# Patient Record
Sex: Male | Born: 1975 | Race: Black or African American | Hispanic: No | Marital: Single | Smoking: Former smoker
Health system: Southern US, Community
[De-identification: ages and names within clinical notes are randomized; demographics above are authoritative.]

## PROBLEM LIST (undated history)

## (undated) DIAGNOSIS — I10 Essential (primary) hypertension: Secondary | ICD-10-CM

---

## 2016-06-16 ENCOUNTER — Encounter (HOSPITAL_COMMUNITY): Payer: Self-pay | Admitting: Emergency Medicine

## 2016-06-16 ENCOUNTER — Ambulatory Visit (HOSPITAL_COMMUNITY)
Admission: EM | Admit: 2016-06-16 | Discharge: 2016-06-16 | Disposition: A | Discharge status: Home / Self Care | Payer: No Typology Code available for payment source | Attending: Family Medicine | Admitting: Family Medicine

## 2016-06-16 DIAGNOSIS — J029 Acute pharyngitis, unspecified: Secondary | ICD-10-CM

## 2016-06-16 MED ORDER — AMOXICILLIN-POT CLAVULANATE 875-125 MG PO TABS: 1.0000 | ORAL_TABLET | Freq: Two times a day (BID) | ORAL | 0 refills | Status: DC

## 2016-06-16 NOTE — Discharge Instructions (Signed)
Take all of medicine, drink lots of fluids, no more smoking, see your doctor if further problems  °

## 2016-06-16 NOTE — ED Triage Notes (Signed)
Pt complains of sore throat and body aches since Sunday.  He denies any fever at home.

## 2016-06-16 NOTE — ED Provider Notes (Signed)
[facility]    CSN: [id number] Arrival date & time: 06/16/16  1052     History   Chief Complaint Chief Complaint  Patient presents with  . Sore Throat  . Generalized Body Aches    HPI Jose Shannon is a 41 y.o. male.   The history is provided by the patient and the spouse.  Sore Throat  This is a new problem. The current episode started more than 2 days ago (h/o tonsillar abscess last yr, smoker). The problem has been gradually worsening. Pertinent negatives include no chest pain and no abdominal pain. The symptoms are aggravated by swallowing.    History reviewed. No pertinent past medical history.  There are no active problems to display for this patient.   History reviewed. No pertinent surgical history.     Home Medications    Prior to Admission medications   Medication Sig Start Date End Date Taking? Authorizing Provider  amoxicillin-clavulanate (AUGMENTIN) 875-125 MG tablet Take 1 tablet by mouth 2 (two) times daily. 06/16/16   Linna HoffJames D Lanny Lipkin, MD    Family History History reviewed. No pertinent family history.  Social History Social History  Substance Use Topics  . Smoking status: Current Some Day Smoker  . Smokeless tobacco: Never Used  . Alcohol use Yes     Comment: occasional     Allergies   Patient has no known allergies.   Review of Systems Review of Systems  Constitutional: Positive for chills and fever.  HENT: Positive for congestion and postnasal drip.   Respiratory: Negative.   Cardiovascular: Negative.  Negative for chest pain.  Gastrointestinal: Negative.  Negative for abdominal pain.     Physical Exam Triage Vital Signs ED Triage Vitals [06/16/16 1145]  Enc Vitals Group     BP 126/85     Pulse Rate 93     Resp      Temp 99.4 F (37.4 C)     Temp src      SpO2 99 %     Weight      Height      Head Circumference      Peak Flow      Pain Score 8     Pain Loc      Pain Edu?      Excl. in GC?     No data found.   Updated Vital Signs BP 126/85 (BP Location: Right Arm)   Pulse 93   Temp 99.4 F (37.4 C)   SpO2 99%   Visual Acuity Right Eye Distance:   Left Eye Distance:   Bilateral Distance:    Right Eye Near:   Left Eye Near:    Bilateral Near:     Physical Exam  Constitutional: He is oriented to person, place, and time. He appears well-developed and well-nourished. No distress.  HENT:  Right Ear: External ear normal.  Left Ear: External ear normal.  Nose: Nose normal.  Mouth/Throat: Oropharynx is clear and moist.  Neck: Normal range of motion. Neck supple.  Cardiovascular: Normal rate, regular rhythm, normal heart sounds and intact distal pulses.   Pulmonary/Chest: Effort normal and breath sounds normal.  Lymphadenopathy:    He has no cervical adenopathy.  Neurological: He is alert and oriented to person, place, and time.  Nursing note and vitals reviewed.    UC Treatments / Results  Labs (all labs ordered are listed, but only abnormal results are displayed) Labs Reviewed - No data to display  EKG  EKG Interpretation None       Radiology No results found.  Procedures Procedures (including critical care time)  Medications Ordered in UC Medications - No data to display   Initial Impression / Assessment and Plan / UC Course  I have reviewed the triage vital signs and the nursing notes.  Pertinent labs & imaging results that were available during my care of the patient were reviewed by me and considered in my medical decision making (see chart for details).       Final Clinical Impressions(s) / UC Diagnoses   Final diagnoses:  Pharyngitis, unspecified etiology    New Prescriptions Discharge Medication List as of 06/16/2016 12:12 PM    START taking these medications   Details  amoxicillin-clavulanate (AUGMENTIN) 875-125 MG tablet Take 1 tablet by mouth 2 (two) times daily., Starting Wed 06/16/2016, Print         Linna Hoff,  MD 06/18/16 [phone]

## 2016-10-22 ENCOUNTER — Encounter (HOSPITAL_COMMUNITY): Payer: Self-pay | Admitting: Emergency Medicine

## 2016-10-22 ENCOUNTER — Ambulatory Visit (HOSPITAL_COMMUNITY)
Admission: EM | Admit: 2016-10-22 | Discharge: 2016-10-22 | Disposition: A | Discharge status: Home / Self Care | Payer: No Typology Code available for payment source | Attending: Internal Medicine | Admitting: Internal Medicine

## 2016-10-22 DIAGNOSIS — M545 Low back pain, unspecified: Secondary | ICD-10-CM

## 2016-10-22 DIAGNOSIS — S39012A Strain of muscle, fascia and tendon of lower back, initial encounter: Secondary | ICD-10-CM | POA: Diagnosis not present

## 2016-10-22 DIAGNOSIS — T148XXA Other injury of unspecified body region, initial encounter: Secondary | ICD-10-CM | POA: Diagnosis not present

## 2016-10-22 MED ORDER — METHOCARBAMOL 500 MG PO TABS: 500.0000 mg | ORAL_TABLET | Freq: Two times a day (BID) | ORAL | 0 refills | Status: DC

## 2016-10-22 MED ORDER — TRAMADOL HCL 50 MG PO TABS: 50.0000 mg | ORAL_TABLET | Freq: Four times a day (QID) | ORAL | 0 refills | Status: DC | PRN

## 2016-10-22 MED ORDER — KETOROLAC TROMETHAMINE 60 MG/2ML IM SOLN
45.0000 mg | Freq: Once | INTRAMUSCULAR | Status: AC
  Administered 2016-10-22: 45 mg via INTRAMUSCULAR

## 2016-10-22 MED ORDER — NAPROXEN 375 MG PO TABS: 375.0000 mg | ORAL_TABLET | Freq: Two times a day (BID) | ORAL | 0 refills | Status: DC

## 2016-10-22 MED ORDER — ACETAMINOPHEN 325 MG PO TABS
650.0000 mg | ORAL_TABLET | Freq: Once | ORAL | Status: AC
  Administered 2016-10-22: 650 mg via ORAL

## 2016-10-22 MED ORDER — ACETAMINOPHEN 325 MG PO TABS
ORAL_TABLET | ORAL | Status: AC
  Filled 2016-10-22: qty 2

## 2016-10-22 MED ORDER — KETOROLAC TROMETHAMINE 60 MG/2ML IM SOLN
INTRAMUSCULAR | Status: AC
  Filled 2016-10-22: qty 2

## 2016-10-22 NOTE — ED Provider Notes (Signed)
CSN: [id number]     Arrival date & time 10/22/16  1512 History   First MD Initiated Contact with Patient 10/22/16 1615     Chief Complaint  Patient presents with  . Back Pain   (Consider location/radiation/quality/duration/timing/severity/associated sxs/prior Treatment) 41 year old male presents to the urgent care with pain across the low back. States he awoke 2 days ago with pain has been getting worse. 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 This is a repetitive type work which requires a lot of stress to the lower back. Denies any known trauma, single injurious event, fall or other injury. Denies focal paresthesias or weakness. Pain is worse when leaning forward, rotating the waist left and right.      History reviewed. No pertinent past medical history. History reviewed. No pertinent surgical history. No family history on file. Social History  Substance Use Topics  . Smoking status: Current Some Day Smoker  . Smokeless tobacco: Never Used  . Alcohol use Yes     Comment: weekends    Review of Systems  Constitutional: Negative.   Respiratory: Negative.   Gastrointestinal: Negative.   Genitourinary: Negative.   Musculoskeletal: Positive for back pain and myalgias. Negative for neck pain and neck stiffness.       As per HPI  Skin: Negative.   Neurological: Negative for dizziness, weakness, numbness and headaches.  All other systems reviewed and are negative.   Allergies  Patient has no known allergies.  Home Medications   Prior to Admission medications   Medication Sig Start Date End Date Taking? Authorizing Provider  methocarbamol (ROBAXIN) 500 MG tablet Take 1 tablet (500 mg total) by mouth 2 (two) times daily. For muscle relaxation. May cause drowsiness. 10/22/16   Hayden Rasmussen, NP  naproxen (NAPROSYN) 375 MG tablet Take 1 tablet (375 mg total) by mouth 2 (two) times  daily. 10/22/16   Hayden Rasmussen, NP  traMADol (ULTRAM) 50 MG tablet Take 1 tablet (50 mg total) by mouth every 6 (six) hours as needed. 10/22/16   Hayden Rasmussen, NP   Meds Ordered and Administered this Visit   Medications  ketorolac (TORADOL) injection 45 mg (45 mg Intramuscular Given 10/22/16 1637)  acetaminophen (TYLENOL) tablet 650 mg (650 mg Oral Given 10/22/16 1637)    BP (!) 145/93   Pulse 90   Temp 98.5 F (36.9 C) (Oral)   Resp 16   Ht 6\' 3"  (1.905 m)   Wt 175 lb (79.4 kg)   SpO2 99%   BMI 21.87 kg/m  No data found.   Physical Exam  Constitutional: He is oriented to person, place, and time. He appears well-developed and well-nourished. No distress.  HENT:  Head: Normocephalic and atraumatic.  Eyes: EOM are normal. Left eye exhibits no discharge.  Neck: Normal range of motion. Neck supple.  Pulmonary/Chest: Effort normal and breath sounds normal.  Musculoskeletal: He exhibits tenderness. He exhibits no edema or deformity.  Tenderness across the para lumbosacral musculature. No spinal tenderness. No swelling or discoloration. Pain is reproduced with having the patient lean forward. Having the patient extend the legs at the knee also produces pain in the back. Negative straight leg raise. No lower extremity weakness.  Neurological: He is alert and oriented to person, place, and time. No cranial nerve deficit.  Skin: Skin is warm and dry.  Psychiatric: He has a normal mood and affect.  Nursing note and  vitals reviewed.   Urgent Care Course     Procedures (including critical care time)  Labs Review Labs Reviewed - No data to display  Imaging Review No results found.   Visual Acuity Review  Right Eye Distance:   Left Eye Distance:   Bilateral Distance:    Right Eye Near:   Left Eye Near:    Bilateral Near:         MDM   1. Strain of lumbar region, initial encounter   2. Acute bilateral low back pain without sciatica   3. Muscle strain    Generally for  the first couple days applying ice helps. After a couple days of pain start applying heat. You may use a heating pad or therma care wraps/patches. These last up to 8 hours or so. Start performing light stretches of the low back muscles. No heavy lifting or bending or twisting for the next few days. Most likely the reason for your back pain is due to the type of job that you do. Meds ordered this encounter  Medications  . ketorolac (TORADOL) injection 45 mg  . acetaminophen (TYLENOL) tablet 650 mg  . naproxen (NAPROSYN) 375 MG tablet    Sig: Take 1 tablet (375 mg total) by mouth 2 (two) times daily.    Dispense:  20 tablet    Refill:  0    Order Specific Question:   Supervising Provider    Answer:   Eustace MooreMURRAY, LAURA W [[id number]]  . methocarbamol (ROBAXIN) 500 MG tablet    Sig: Take 1 tablet (500 mg total) by mouth 2 (two) times daily. For muscle relaxation. May cause drowsiness.    Dispense:  20 tablet    Refill:  0    Order Specific Question:   Supervising Provider    Answer:   Eustace MooreMURRAY, LAURA W [[id number]]  . traMADol (ULTRAM) 50 MG tablet    Sig: Take 1 tablet (50 mg total) by mouth every 6 (six) hours as needed.    Dispense:  15 tablet    Refill:  0    Order Specific Question:   Supervising Provider    Answer:   Eustace MooreMURRAY, LAURA W [[id number]]       Hayden RasmussenMabe, Jeriah Skufca, NP 10/22/16 [phone]

## 2016-10-22 NOTE — ED Triage Notes (Signed)
PT reports back pain secondary to an injury at work a few years ago. PT has had a severe flare up for last 3 days. PT reports lumbar pain, bilateral hip pain, and left leg pain. PT took a BC powder and ibuprofen this AM with no relief

## 2016-10-22 NOTE — Discharge Instructions (Signed)
Generally for the first couple days applying ice helps. After a couple days of pain start applying heat. You may use a heating pad or therma care wraps/patches. These last up to 8 hours or so. Start performing light stretches of the low back muscles. No heavy lifting or bending or twisting for the next few days. Most likely the reason for your back pain is due to the type of job that you do.

## 2016-11-16 ENCOUNTER — Ambulatory Visit (INDEPENDENT_AMBULATORY_CARE_PROVIDER_SITE_OTHER): Payer: No Typology Code available for payment source

## 2016-11-16 ENCOUNTER — Ambulatory Visit (INDEPENDENT_AMBULATORY_CARE_PROVIDER_SITE_OTHER): Payer: No Typology Code available for payment source | Admitting: Physician Assistant

## 2016-11-16 ENCOUNTER — Encounter: Payer: Self-pay | Admitting: Physician Assistant

## 2016-11-16 VITALS — BP 136/87 | HR 83 | Temp 98.3°F | Resp 17 | Ht 73.5 in | Wt 178.0 lb

## 2016-11-16 DIAGNOSIS — M545 Low back pain, unspecified: Secondary | ICD-10-CM

## 2016-11-16 DIAGNOSIS — R29898 Other symptoms and signs involving the musculoskeletal system: Secondary | ICD-10-CM

## 2016-11-16 MED ORDER — TRAMADOL HCL 50 MG PO TABS: 50.0000 mg | ORAL_TABLET | Freq: Three times a day (TID) | ORAL | 0 refills | Status: DC | PRN

## 2016-11-16 MED ORDER — PREDNISONE 20 MG PO TABS: ORAL_TABLET | ORAL | 0 refills | Status: DC

## 2016-11-16 MED ORDER — MELOXICAM 15 MG PO TABS: 15.0000 mg | ORAL_TABLET | Freq: Every day | ORAL | 0 refills | Status: DC

## 2016-11-16 NOTE — Progress Notes (Signed)
PRIMARY CARE AT [facility] [address] [place] [address] [phone]  Date:  11/16/2016   Name:  Jose Shannon [id number]   DOB:  12-29-1975   MRN:  [id number]  PCP:  Doristine Bosworth, MD    History of Present Illness:  Jose Shannon is a 41 y.o. male patient who presents to PCP with  Chief Complaint  Patient presents with  . Back Pain     Patient is here today with low back pain.  This has been for months, but over the last couple of weeks the pain has worsened.  It is at his low back and radiates across.  He has no instability.  He feels weakness down his extremities and fatigue.  Pain is worsened with bending.  He has difficulty even tying his shoes.  No incontinence.  No instability.  He has tried heat and given controlled meds, but this has not helped much.  He was to get an [place] in [place] due to his pain, however he relocated without follow up. No abnormal weight loss.  There are no active problems to display for this patient.   No past medical history on file.  No past surgical history on file.  Social History  Substance Use Topics  . Smoking status: Current Every Day Smoker    Packs/day: 3.00    Years: 0.20    Types: Cigars  . Smokeless tobacco: Never Used  . Alcohol use Yes     Comment: weekends    No family history on file.  No Known Allergies  Medication list has been reviewed and updated.  No current outpatient prescriptions on file prior to visit.   No current facility-administered medications on file prior to visit.     ROS ROS otherwise unremarkable unless listed above.  Physical Examination: BP 136/87   Pulse 83   Temp 98.3 F (36.8 C) (Oral)   Resp 17   Ht 6' 1.5" (1.867 m)   Wt 178 lb (80.7 kg)   SpO2 98%   BMI 23.17 kg/m  Ideal Body Weight: Weight in (lb) to have BMI = 25: 191.7  Physical Exam  Constitutional: He is oriented to person, place, and time. He appears well-developed and well-nourished. No distress.  HENT:   Head: Normocephalic and atraumatic.  Eyes: Pupils are equal, round, and reactive to light. Conjunctivae and EOM are normal.  Cardiovascular: Normal rate and regular rhythm.  Exam reveals no friction rub.   No murmur heard. Pulmonary/Chest: Effort normal. No respiratory distress.  Musculoskeletal:       Lumbar back: He exhibits decreased range of motion (forward flexion 15 degrees.  ), bony tenderness and pain. He exhibits no swelling and no spasm.  Neurological: He is alert and oriented to person, place, and time.  Skin: Skin is warm and dry. He is not diaphoretic.  Psychiatric: He has a normal mood and affect. His behavior is normal.   Dg Lumbar Spine Complete  Result Date: 11/16/2016 CLINICAL DATA:  Low back pain for the past 3-4 days. No report of injury. EXAM: LUMBAR SPINE - COMPLETE 4+ VIEW COMPARISON:  None in PACs FINDINGS: The lumbar vertebral bodies are preserved in height. The disc space heights are well maintained. There is no spondylolisthesis. The pedicles and transverse processes are intact. There is mild facet joint hypertrophy at L5-S1. The observed portions of the sacrum are normal. There is calcification in the wall of the abdominal aorta. IMPRESSION: There is no acute bony abnormality of  the lumbar spine. There is mild degenerative facet joint change at L5-S1. Electronically Signed   By: David  Swazil[place]Jordan M.D.   On: 11/16/2016 11:29   Dg Sacrum/coccyx  Result Date: 11/16/2016 CLINICAL DATA:  3-4 days of low back and sacral discomfort with limitation of range of motion. No known injury EXAM: SACRUM AND COCCYX - 2+ VIEW COMPARISON:  Lumbar spine series of today's date FINDINGS: The sacrum is subjectively adequately mineralized as is the coccyx. The presacral soft tissues are normal. No acute sacral fracture is observed. The SI joints are grossly normal for age. There are least 3 intact sacral struts visualized. IMPRESSION: There is no acute or significant chronic bony abnormality of  the sacrum or coccyx. Electronically Signed   By: David  [place]Jordan M.D.   On: 11/16/2016 11:28   Wt Readings from Last 3 Encounters:  11/16/16 178 lb (80.7 kg)  10/22/16 175 lb (79.4 kg)     Assessment and Plan: Jose Shannon is a 41 y.o. male who is here today for low back pain.   Given steroid to aid in reduction of inflammation.  He is in significant pain, and will likely need continued surveillance of this low back pain by ortho.  Consult appreciated at this time.  Lumbar pain - Plan: DG Sacrum/Coccyx, DG Lumbar Spine Complete, predniSONE (DELTASONE) 20 MG tablet, AMB referral to orthopedics  Weakness of both lower extremities - Plan: AMB referral to orthopedics  Trena PlattStephanie Elizbeth Posa, PA-C [facility] 7/27/20183:39 PM

## 2016-11-16 NOTE — Patient Instructions (Addendum)
IF you received an x-ray today, you will receive an invoice from [facility]. Please contact [facility] at [phone] with questions or concerns regarding your invoice.   IF you received labwork today, you will receive an invoice from [facility]. Please contact [facility] at [phone] with questions or concerns regarding your invoice.   Our billing staff will not be able to assist you with questions regarding bills from these companies.  You will be contacted with the lab results as soon as they are available. The fastest way to get your results is to activate your My Chart account. Instructions are located on the last page of this paperwork. If you have not heard from us regarding the results in 2 weeks, please contact this office.    I would like you to ice the back three times per day for 15 minutes.  I am referring you to the orthopedic.  Please await contact.  Low Back Strain Rehab Ask your health care provider which exercises are safe for you. Do exercises exactly as told by your health care provider and adjust them as directed. It is normal to feel mild stretching, pulling, tightness, or discomfort as you do these exercises, but you should stop right away if you feel sudden pain or your pain gets worse. Do not begin these exercises until told by your health care provider. Stretching and range of motion exercises These exercises warm up your muscles and joints and improve the movement and flexibility of your back. These exercises also help to relieve pain, numbness, and tingling. Exercise A: Single knee to chest  1. Lie on your back on a firm surface with both legs straight. 2. Bend one of your knees. Use your hands to move your knee up toward your chest until you feel a gentle stretch in your lower back and buttock. ? Hold your leg in this position by holding onto the front of your knee. ? Keep your other leg as straight as possible. 3. Hold for __________  seconds. 4. Slowly return to the starting position. 5. Repeat with your other leg. Repeat __________ times. Complete this exercise __________ times a day. Exercise B: Prone extension on elbows  1. Lie on your abdomen on a firm surface. 2. Prop yourself up on your elbows. 3. Use your arms to help lift your chest up until you feel a gentle stretch in your abdomen and your lower back. ? This will place some of your body weight on your elbows. If this is uncomfortable, try stacking pillows under your chest. ? Your hips should stay down, against the surface that you are lying on. Keep your hip and back muscles relaxed. 4. Hold for __________ seconds. 5. Slowly relax your upper body and return to the starting position. Repeat __________ times. Complete this exercise __________ times a day. Strengthening exercises These exercises build strength and endurance in your back. Endurance is the ability to use your muscles for a long time, even after they get tired. Exercise C: Pelvic tilt 1. Lie on your back on a firm surface. Bend your knees and keep your feet flat. 2. Tense your abdominal muscles. Tip your pelvis up toward the ceiling and flatten your lower back into the floor. ? To help with this exercise, you may place a small towel under your lower back and try to push your back into the towel. 3. Hold for __________ seconds. 4. Let your muscles relax completely before you repeat this exercise. Repeat __________ times.  Complete this exercise __________ times a day. Exercise D: Alternating arm and leg raises  1. Get on your hands and knees on a firm surface. If you are on a hard floor, you may want to use padding to cushion your knees, such as an exercise mat. 2. Line up your arms and legs. Your hands should be below your shoulders, and your knees should be below your hips. 3. Lift your left leg behind you. At the same time, raise your right arm and straighten it in front of you. ? Do not lift  your leg higher than your hip. ? Do not lift your arm higher than your shoulder. ? Keep your abdominal and back muscles tight. ? Keep your hips facing the ground. ? Do not arch your back. ? Keep your balance carefully, and do not hold your breath. 4. Hold for __________ seconds. 5. Slowly return to the starting position and repeat with your right leg and your left arm. Repeat __________ times. Complete this exercise __________times a day. Exercise J: Single leg lower with bent knees 1. Lie on your back on a firm surface. 2. Tense your abdominal muscles and lift your feet off the floor, one foot at a time, so your knees and hips are bent in an "L" shape (at about 90 degrees). ? Your knees should be over your hips and your lower legs should be parallel to the floor. 3. Keeping your abdominal muscles tense and your knee bent, slowly lower one of your legs so your toe touches the ground. 4. Lift your leg back up to return to the starting position. ? Do not hold your breath. ? Do not let your back arch. Keep your back flat against the ground. 5. Repeat with your other leg. Repeat __________ times. Complete this exercise __________ times a day. Posture and body mechanics  Body mechanics refers to the movements and positions of your body while you do your daily activities. Posture is part of body mechanics. Good posture and healthy body mechanics can help to relieve stress in your body's tissues and joints. Good posture means that your spine is in its natural S-curve position (your spine is neutral), your shoulders are pulled back slightly, and your head is not tipped forward. The following are general guidelines for applying improved posture and body mechanics to your everyday activities. Standing   When standing, keep your spine neutral and your feet about hip-width apart. Keep a slight bend in your knees. Your ears, shoulders, and hips should line up.  When you do a task in which you stand in  one place for a long time, place one foot up on a stable object that is 2-4 inches (5-10 cm) high, such as a footstool. This helps keep your spine neutral. Sitting   When sitting, keep your spine neutral and keep your feet flat on the floor. Use a footrest, if necessary, and keep your thighs parallel to the floor. Avoid rounding your shoulders, and avoid tilting your head forward.  When working at a desk or a computer, keep your desk at a height where your hands are slightly lower than your elbows. Slide your chair under your desk so you are close enough to maintain good posture.  When working at a computer, place your monitor at a height where you are looking straight ahead and you do not have to tilt your head forward or downward to look at the screen. Resting   When lying down and resting, avoid positions  that are most painful for you.  If you have pain with activities such as sitting, bending, stooping, or squatting (flexion-based activities), lie in a position in which your body does not bend very much. For example, avoid curling up on your side with your arms and knees near your chest (fetal position).  If you have pain with activities such as standing for a long time or reaching with your arms (extension-based activities), lie with your spine in a neutral position and bend your knees slightly. Try the following positions: ? Lying on your side with a pillow between your knees. ? Lying on your back with a pillow under your knees. Lifting   When lifting objects, keep your feet at least shoulder-width apart and tighten your abdominal muscles.  Bend your knees and hips and keep your spine neutral. It is important to lift using the strength of your legs, not your back. Do not lock your knees straight out.  Always ask for help to lift heavy or awkward objects. This information is not intended to replace advice given to you by your health care provider. Make sure you discuss any questions  you have with your health care provider. Document Released: 04/12/2005 Document Revised: 12/18/2015 Document Reviewed: 01/22/2015 Elsevier Interactive Patient Education  [employer].

## 2016-12-15 ENCOUNTER — Ambulatory Visit (INDEPENDENT_AMBULATORY_CARE_PROVIDER_SITE_OTHER): Payer: No Typology Code available for payment source | Admitting: Orthopaedic Surgery

## 2016-12-15 ENCOUNTER — Other Ambulatory Visit (INDEPENDENT_AMBULATORY_CARE_PROVIDER_SITE_OTHER): Payer: Self-pay

## 2016-12-15 DIAGNOSIS — M5441 Lumbago with sciatica, right side: Secondary | ICD-10-CM | POA: Diagnosis not present

## 2016-12-15 DIAGNOSIS — M4807 Spinal stenosis, lumbosacral region: Secondary | ICD-10-CM

## 2016-12-15 MED ORDER — TIZANIDINE HCL 4 MG PO TABS: 4.0000 mg | ORAL_TABLET | Freq: Three times a day (TID) | ORAL | 1 refills | Status: DC | PRN

## 2016-12-15 NOTE — Progress Notes (Signed)
Office Visit Note   Patient: Jose Shannon           Date of Birth: 08/23/1975           MRN: [id number] Visit Date: 12/15/2016              Requested by: Garnetta Buddy, PA [address] [place], [place] [address] PCP: Doristine Bosworth, MD   Assessment & Plan: Visit Diagnoses:  1. Acute right-sided low back pain with right-sided sciatica     Plan: I am absolutely concerned giving his clinical exam there is some type of impingement spine or pelvis. His radicular symptoms are quite obvious as well as pain and being a reflexive on the right side is concerning. An MRI is definitely warranted to rule out pathology in the lumbar spine that is causing his symptoms. He is tried and failed all forms conservative treatment. I will send in a muscle relaxant for him. We will see him back after the MRI is obtained. All questions were encouraged and answered.  Follow-Up Instructions: Return in about 2 weeks (around 12/29/2016).   Orders:  No orders of the defined types were placed in this encounter.  Meds ordered this encounter  Medications  . tiZANidine (ZANAFLEX) 4 MG tablet    Sig: Take 1 tablet (4 mg total) by mouth every 8 (eight) hours as needed for muscle spasms.    Dispense:  60 tablet    Refill:  1      Procedures: No procedures performed   Clinical Data: No additional findings.   Subjective: No chief complaint on file. Patient is someone him seeing for the first time as a referral from either the ER or primary care physician. 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 He is getting numbness and tingling going down his right leg sometimes just the mostly behind his knee. He's had limitations with flexion extension of his lumbar spine. He's had difficulty sleeping at night. This is worsening for him. He has problems with pain when sitting. Even bowel movements and urinating certainly call some type of  pain. He says his urine is clear though. He is already been on a steroid taper and anti-inflammatories as well as pain medication. He is a thin individual. His work and activity modification and extension exercises well. This is helped and things are worsening for him.  HPI  Review of Systems He currently denies any headache, chest pain, short of breath, fever, chills, nausea, vomiting.  Objective: Vital Signs: There were no vitals taken for this visit.  Physical Exam He is alert and oriented 3 in no acute distress but obvious discomfort. He is very slow to mobilize. Ortho Exam He is a very thin individual so examination is easier. He has limited flexion-extension of the lumbar spines pain his spine appears straight and the skin is normal appearing around the spine. He has decreased sensation L5 distribution on the right side. His reflexes are not equal with normal reflexes on the left and he is almost a reflexive on the right. He has negative clonus bilaterally. He has good strength today in his lower extremities. Putting his hips at the range of motion causes some pelvic pain but no pain in the groin on either side. Specialty Comments:  No specialty comments available.  Imaging: No results found. Plain films of lumbar spine and pelvis and sacrum show no obvious bony malalignment or  deformity. He does have a lot of air in his bowel stools suggesting pain.  PMFS History: There are no active problems to display for this patient.  No past medical history on file.  No family history on file.  No past surgical history on file. Social History   Occupational History  . Not on file.   Social History Main Topics  . Smoking status: Current Every Day Smoker    Packs/day: 3.00    Years: 0.20    Types: Cigars  . Smokeless tobacco: Never Used  . Alcohol use Yes     Comment: weekends  . Drug use: Unknown    Types: Marijuana  . Sexual activity: Not on file

## 2016-12-17 ENCOUNTER — Other Ambulatory Visit: Payer: No Typology Code available for payment source

## 2017-01-03 ENCOUNTER — Ambulatory Visit (INDEPENDENT_AMBULATORY_CARE_PROVIDER_SITE_OTHER): Payer: No Typology Code available for payment source | Admitting: Physician Assistant

## 2017-07-27 ENCOUNTER — Encounter: Payer: Self-pay | Admitting: Physician Assistant

## 2018-11-24 ENCOUNTER — Encounter (HOSPITAL_COMMUNITY): Payer: Self-pay

## 2018-11-24 ENCOUNTER — Other Ambulatory Visit: Payer: Self-pay

## 2018-11-24 ENCOUNTER — Ambulatory Visit (HOSPITAL_COMMUNITY)
Admission: EM | Admit: 2018-11-24 | Discharge: 2018-11-24 | Disposition: A | Discharge status: Other Acute Inpatient Hospital | Payer: No Typology Code available for payment source

## 2018-11-24 ENCOUNTER — Inpatient Hospital Stay (HOSPITAL_COMMUNITY)
Admission: EM | Admit: 2018-11-24 | Discharge: 2018-11-26 | DRG: 684 | Disposition: A | Discharge status: Home / Self Care | Payer: 59 | Attending: Internal Medicine | Admitting: Internal Medicine

## 2018-11-24 ENCOUNTER — Encounter (HOSPITAL_COMMUNITY): Payer: Self-pay | Admitting: Emergency Medicine

## 2018-11-24 DIAGNOSIS — R112 Nausea with vomiting, unspecified: Secondary | ICD-10-CM | POA: Diagnosis present

## 2018-11-24 DIAGNOSIS — Z79899 Other long term (current) drug therapy: Secondary | ICD-10-CM

## 2018-11-24 DIAGNOSIS — R252 Cramp and spasm: Secondary | ICD-10-CM

## 2018-11-24 DIAGNOSIS — F17290 Nicotine dependence, other tobacco product, uncomplicated: Secondary | ICD-10-CM | POA: Diagnosis present

## 2018-11-24 DIAGNOSIS — F1290 Cannabis use, unspecified, uncomplicated: Secondary | ICD-10-CM | POA: Diagnosis present

## 2018-11-24 DIAGNOSIS — E860 Dehydration: Secondary | ICD-10-CM | POA: Diagnosis present

## 2018-11-24 DIAGNOSIS — I10 Essential (primary) hypertension: Secondary | ICD-10-CM | POA: Diagnosis present

## 2018-11-24 DIAGNOSIS — N179 Acute kidney failure, unspecified: Principal | ICD-10-CM | POA: Diagnosis present

## 2018-11-24 DIAGNOSIS — D72829 Elevated white blood cell count, unspecified: Secondary | ICD-10-CM | POA: Diagnosis present

## 2018-11-24 DIAGNOSIS — Z20828 Contact with and (suspected) exposure to other viral communicable diseases: Secondary | ICD-10-CM | POA: Diagnosis present

## 2018-11-24 DIAGNOSIS — Z79891 Long term (current) use of opiate analgesic: Secondary | ICD-10-CM

## 2018-11-24 DIAGNOSIS — Z7952 Long term (current) use of systemic steroids: Secondary | ICD-10-CM

## 2018-11-24 LAB — COMPREHENSIVE METABOLIC PANEL
ALT: 23 U/L (ref 0–44)
AST: 46 U/L — ABNORMAL HIGH (ref 15–41)
Albumin: 5.7 g/dL — ABNORMAL HIGH (ref 3.5–5.0)
Alkaline Phosphatase: 102 U/L (ref 38–126)
Anion gap: 19 — ABNORMAL HIGH (ref 5–15)
BUN: 36 mg/dL — ABNORMAL HIGH (ref 6–20)
CO2: 21 mmol/L — ABNORMAL LOW (ref 22–32)
Calcium: 10.9 mg/dL — ABNORMAL HIGH (ref 8.9–10.3)
Chloride: 95 mmol/L — ABNORMAL LOW (ref 98–111)
Creatinine, Ser: 3.01 mg/dL — ABNORMAL HIGH (ref 0.61–1.24)
GFR calc Af Amer: 28 mL/min — ABNORMAL LOW (ref >60)
GFR calc non Af Amer: 24 mL/min — ABNORMAL LOW (ref >60)
Glucose, Bld: 93 mg/dL (ref 70–99)
Potassium: 4.2 mmol/L (ref 3.5–5.1)
Sodium: 135 mmol/L (ref 135–145)
Total Bilirubin: 1.7 mg/dL — ABNORMAL HIGH (ref 0.3–1.2)
Total Protein: 9.8 g/dL — ABNORMAL HIGH (ref 6.5–8.1)

## 2018-11-24 LAB — URINALYSIS, ROUTINE W REFLEX MICROSCOPIC
Bacteria, UA: NONE SEEN
Bilirubin Urine: NEGATIVE
Glucose, UA: NEGATIVE mg/dL
Ketones, ur: 5 mg/dL — AB
Nitrite: NEGATIVE
Protein, ur: 100 mg/dL — AB
Specific Gravity, Urine: 1.026 (ref 1.005–1.030)
pH: 5 (ref 5.0–8.0)

## 2018-11-24 LAB — CBC
HCT: 47.9 % (ref 39.0–52.0)
Hemoglobin: 16 g/dL (ref 13.0–17.0)
MCH: 23.4 pg — ABNORMAL LOW (ref 26.0–34.0)
MCHC: 33.4 g/dL (ref 30.0–36.0)
MCV: 70.1 fL — ABNORMAL LOW (ref 80.0–100.0)
Platelets: 283 10*3/uL (ref 150–400)
RBC: 6.83 MIL/uL — ABNORMAL HIGH (ref 4.22–5.81)
RDW: 17.7 % — ABNORMAL HIGH (ref 11.5–15.5)
WBC: 13 10*3/uL — ABNORMAL HIGH (ref 4.0–10.5)
nRBC: 0.2 % (ref 0.0–0.2)

## 2018-11-24 LAB — SARS CORONAVIRUS 2 BY RT PCR (HOSPITAL ORDER, PERFORMED IN ~~LOC~~ HOSPITAL LAB): SARS Coronavirus 2: NEGATIVE

## 2018-11-24 LAB — MAGNESIUM: Magnesium: 2.4 mg/dL (ref 1.7–2.4)

## 2018-11-24 LAB — LIPASE, BLOOD: Lipase: 25 U/L (ref 11–51)

## 2018-11-24 LAB — CK: Total CK: 1052 U/L — ABNORMAL HIGH (ref 49–397)

## 2018-11-24 MED ORDER — LACTATED RINGERS IV BOLUS
1000.0000 mL | Freq: Once | INTRAVENOUS | Status: AC
  Administered 2018-11-24: 1000 mL via INTRAVENOUS

## 2018-11-24 MED ORDER — ONDANSETRON 4 MG PO TBDP
4.0000 mg | ORAL_TABLET | Freq: Once | ORAL | Status: AC | PRN
  Administered 2018-11-24: 4 mg via ORAL
  Filled 2018-11-24: qty 1

## 2018-11-24 MED ORDER — SODIUM CHLORIDE 0.9% FLUSH: 3.0000 mL | Freq: Once | INTRAVENOUS | Status: DC

## 2018-11-24 MED ORDER — LACTATED RINGERS IV BOLUS
1000.0000 mL | Freq: Once | INTRAVENOUS | Status: AC
  Administered 2018-11-24: 22:00:00 1000 mL via INTRAVENOUS

## 2018-11-24 NOTE — H&P (Signed)
History and Physical   Jose Shannon JKK:[id number] DOB: 07-09-1975 DOA: 11/24/2018  Referring MD/NP/PA: Dr. Jola Schmidt  PCP: Forrest Moron, MD   Outpatient Specialists: None  Patient coming from: Home  Chief Complaint: Nausea vomiting and muscle cramping  HPI: Jose Shannon is a 43 y.o. male with medical history significant of degenerative disc disease otherwise no significant past medical history who has been having generalized malaise and muscle cramping as well as intractable nausea vomiting for 2 days.  Patient denied any sick contact.  He denied any fever.  Denied any diarrhea.  Patient has significant weakness.  He has tried giving Luke's and no success.  He came to the ER where he was found to be in acute kidney injury and is therefore being admitted for treatment.  Patient is still unable to keep anything down.  Initial supportive care in the ER did not resolve patient's symptoms.  Patient reported using marijuana and his last usage was 4 days ago.  Denied any other drug abuse..  ED Course: Temperature is 98.1 blood pressure 143/106 pulse 113 respirate of 18 oxygen sat 93% on room air.  White count is 13.0 sodium 135 potassium 4.2 chloride 95 CO2 of 21 BUN 36 and creatinine 3.01 calcium 10.9.Urinalysis is negative.  Urine drug screen is pending and patient otherwise stable so he will be admitted for further work-up.  Review of Systems: As per HPI otherwise 10 point review of systems negative.    History reviewed. No pertinent past medical history.  History reviewed. No pertinent surgical history.   reports that he has been smoking cigars. He has a 0.60 pack-year smoking history. He has never used smokeless tobacco. He reports current alcohol use.  Drug: Marijuana.  No Known Allergies  No family history on file.   Prior to Admission medications   Medication Sig Start Date End Date Taking? Authorizing Provider  acetaminophen (TYLENOL) 650 MG CR  tablet Take 650 mg by mouth every 8 (eight) hours as needed for pain.   Yes [provider]  meclizine (DRAMAMINE II) 25 MG tablet Take 25-50 mg by mouth 3 (three) times daily as needed for nausea.   Yes [provider]  meloxicam (MOBIC) 15 MG tablet Take 1 tablet (15 mg total) by mouth daily. Patient not taking: Reported on 11/24/2018 11/16/16   Ivar Drape D, PA  predniSONE (DELTASONE) 20 MG tablet Take 3 PO QAM x2days, 2 PO QAM x2days, 1 PO QAM x3days Patient not taking: Reported on 11/24/2018 11/16/16   Ivar Drape D, PA  tiZANidine (ZANAFLEX) 4 MG tablet Take 1 tablet (4 mg total) by mouth every 8 (eight) hours as needed for muscle spasms. Patient not taking: Reported on 11/24/2018 12/15/16   Mcarthur Rossetti, MD  traMADol (ULTRAM) 50 MG tablet Take 1-2 tablets (50-100 mg total) by mouth every 8 (eight) hours as needed. Patient not taking: Reported on 11/24/2018 11/16/16   Joretta Bachelor, [place]    Physical Exam: Vitals:   11/24/18 1941 11/24/18 2200  BP: (!) 141/111 (!) 143/106  Pulse: (!) 113 94  Resp: 18 16  Temp: 98.1 F (36.7 C)   TempSrc: Oral   SpO2: 98% 93%      Constitutional: NAD, anxious Vitals:   11/24/18 1941 11/24/18 2200  BP: (!) 141/111 (!) 143/106  Pulse: (!) 113 94  Resp: 18 16  Temp: 98.1 F (36.7 C)   TempSrc: Oral   SpO2: 98% 93%   Eyes: PERRL, lids  and conjunctivae normal ENMT: Mucous membranes are dry. Posterior pharynx clear of any exudate or lesions.Normal dentition.  Neck: normal, supple, no masses, no thyromegaly Respiratory: clear to auscultation bilaterally, no wheezing, no crackles. Normal respiratory effort. No accessory muscle use.  Cardiovascular: Regular rate and rhythm, no murmurs / rubs / gallops. No extremity edema. 2+ pedal pulses. No carotid bruits.  Abdomen: no tenderness, no masses palpated. No hepatosplenomegaly. Bowel sounds positive.  Musculoskeletal: no clubbing / cyanosis. No joint  deformity upper and lower extremities. Good ROM, no contractures. Normal muscle tone.  Skin: no rashes, lesions, ulcers. No induration Neurologic: CN 2-12 grossly intact. Sensation intact, DTR normal. Strength 5/5 in all 4.  Psychiatric: Normal judgment and insight. Alert and oriented x 3. Normal mood.     Labs on Admission: I have personally reviewed following labs and imaging studies  CBC: Recent Labs  Lab 11/24/18 2033  WBC 13.0*  HGB 16.0  HCT 47.9  MCV 70.1*  PLT 283   Basic Metabolic Panel: Recent Labs  Lab 11/24/18 2033 11/24/18 2123  NA 135  --   K 4.2  --   CL 95*  --   CO2 21*  --   GLUCOSE 93  --   BUN 36*  --   CREATININE 3.01*  --   CALCIUM 10.9*  --   MG  --  2.4   GFR: CrCl cannot be calculated (Unknown ideal weight.). Liver Function Tests: Recent Labs  Lab 11/24/18 2033  AST 46*  ALT 23  ALKPHOS 102  BILITOT 1.7*  PROT 9.8*  ALBUMIN 5.7*   Recent Labs  Lab 11/24/18 2033  LIPASE 25   No results for input(s): AMMONIA in the last 168 hours. Coagulation Profile: No results for input(s): INR, PROTIME in the last 168 hours. Cardiac Enzymes: Recent Labs  Lab 11/24/18 2123  CKTOTAL 1,052*   BNP (last 3 results) No results for input(s): PROBNP in the last 8760 hours. HbA1C: No results for input(s): HGBA1C in the last 72 hours. CBG: No results for input(s): GLUCAP in the last 168 hours. Lipid Profile: No results for input(s): CHOL, HDL, LDLCALC, TRIG, CHOLHDL, LDLDIRECT in the last 72 hours. Thyroid Function Tests: No results for input(s): TSH, T4TOTAL, FREET4, T3FREE, THYROIDAB in the last 72 hours. Anemia Panel: No results for input(s): VITAMINB12, FOLATE, FERRITIN, TIBC, IRON, RETICCTPCT in the last 72 hours. Urine analysis:    Component Value Date/Time   COLORURINE AMBER (A) 11/24/2018 2053   APPEARANCEUR CLOUDY (A) 11/24/2018 2053   LABSPEC 1.026 11/24/2018 2053   PHURINE 5.0 11/24/2018 2053   GLUCOSEU NEGATIVE 11/24/2018 2053    HGBUR SMALL (A) 11/24/2018 2053   BILIRUBINUR NEGATIVE 11/24/2018 2053   KETONESUR 5 (A) 11/24/2018 2053   PROTEINUR 100 (A) 11/24/2018 2053   NITRITE NEGATIVE 11/24/2018 2053   LEUKOCYTESUR TRACE (A) 11/24/2018 2053   Sepsis Labs: @LABRCNTIP (procalcitonin:4,lacticidven:4) )No results found for this or any previous visit (from the past 240 hour(s)).   Radiological Exams on Admission: No results found.    Assessment/Plan Principal Problem:   ARF (acute renal failure) (HCC) Active Problems:   Nausea & vomiting   Benign essential HTN     #1 acute kidney injury: More than likely prerenal.  Patient used to be on Mobic but has not been taking it.  No fever or chills.  We will admit the patient and hydrate aggressively.  Monitor renal function.  #2 nausea with vomiting: Cause is not clear.  We will check urine  drug screen.  COVID-19 is negative.  No significant abdominal pain or diarrhea.  No distention otherwise we will check CT abdomen or acute abdominal series.  Monitor and treat symptomatically.  #3 elevated blood pressure: Patient has hypertension without known diagnosis of hypertension.  Could be secondary to acute illness.  We will check blood pressure regularly and if persistently elevated will treat.  #4 leukocytosis: Most likely due to demargination.  Monitor white count.  DVT prophylaxis: Heparin Code Status: Full code Family Communication: No family at bedside Disposition Plan: Home Consults called: None Admission status: Observation  Severity of Illness: The appropriate patient status for this patient is INPATIENT. Inpatient status is judged to be reasonable and necessary in order to provide the required intensity of service to ensure the patient's safety. The patient's presenting symptoms, physical exam findings, and initial radiographic and laboratory data in the context of their chronic comorbidities is felt to place them at high risk for further clinical  deterioration. Furthermore, it is not anticipated that the patient will be medically stable for discharge from the hospital within 2 midnights of admission. The following factors support the patient status of inpatient.   " The patient's presenting symptoms include nausea vomiting. " The worrisome physical exam findings include dry mucous membranes. " The initial radiographic and laboratory data are worrisome because of creatinine of 3.01. " The chronic co-morbidities include none.   * I certify that at the point of admission it is my clinical judgment that the patient will require inpatient hospital care spanning beyond 2 midnights from the point of admission due to high intensity of service, high risk for further deterioration and high frequency of surveillance required.Lonia Blood*    Teola Felipe,LAWAL MD [facility] Pager [phone]  If 7PM-7AM, please contact night-coverage [email or web address] Password [email or web address]  11/24/2018, 10:13 PM

## 2018-11-24 NOTE — ED Provider Notes (Signed)
[facility]    CSN: [id number] Arrival date & time: 11/24/18  0865     History   Chief Complaint Chief Complaint  Patient presents with  . Generalized Body Aches  . Emesis    HPI Jose Shannon is a 43 y.o. male.   Patient presents with multiple episodes of vomiting daily and severe muscle cramping for 2 days.  He denies fever, chills, diarrhea, or other symptoms.  He states he has been unable to keep down food or liquids since his vomiting started.       The history is provided by the patient.    History reviewed. No pertinent past medical history.  There are no active problems to display for this patient.   History reviewed. No pertinent surgical history.     Home Medications    Prior to Admission medications   Medication Sig Start Date End Date Taking? Authorizing Provider  meloxicam (MOBIC) 15 MG tablet Take 1 tablet (15 mg total) by mouth daily. 11/16/16   Ivar Drape D, PA  predniSONE (DELTASONE) 20 MG tablet Take 3 PO QAM x2days, 2 PO QAM x2days, 1 PO QAM x3days Patient not taking: Reported on 11/24/2018 11/16/16   Ivar Drape D, PA  tiZANidine (ZANAFLEX) 4 MG tablet Take 1 tablet (4 mg total) by mouth every 8 (eight) hours as needed for muscle spasms. 12/15/16   Mcarthur Rossetti, MD  traMADol (ULTRAM) 50 MG tablet Take 1-2 tablets (50-100 mg total) by mouth every 8 (eight) hours as needed. 11/16/16   Joretta Bachelor, PA    Family History History reviewed. No pertinent family history.  Social History Social History   Tobacco Use  . Smoking status: Current Every Day Smoker    Packs/day: 3.00    Years: 0.20    Pack years: 0.60    Types: Cigars  . Smokeless tobacco: Never Used  Substance Use Topics  . Alcohol use: Yes    Comment: weekends  . Drug use: Not on file     Allergies   Patient has no known allergies.   Review of Systems Review of Systems  Constitutional: Negative for chills and fever.   HENT: Negative for ear pain and sore throat.   Eyes: Negative for pain and visual disturbance.  Respiratory: Negative for cough and shortness of breath.   Cardiovascular: Negative for chest pain and palpitations.  Gastrointestinal: Positive for vomiting. Negative for abdominal pain and diarrhea.  Genitourinary: Negative for dysuria and hematuria.  Musculoskeletal: Positive for myalgias. Negative for arthralgias and back pain.  Skin: Negative for color change and rash.  Neurological: Negative for seizures and syncope.  All other systems reviewed and are negative.    Physical Exam Triage Vital Signs ED Triage Vitals [11/24/18 1844]  Enc Vitals Group     BP (!) 139/92     Pulse Rate (!) 110     Resp 18     Temp 97.6 F (36.4 C)     Temp Source Temporal     SpO2 99 %     Weight      Height      Head Circumference      Peak Flow      Pain Score 6     Pain Loc      Pain Edu?      Excl. in [place]?    No data found.  Updated Vital Signs BP (!) 139/92 (BP Location: Left Arm)   Pulse (!) 110  Temp 97.6 F (36.4 C) (Temporal)   Resp 18   SpO2 99%   Visual Acuity Right Eye Distance:   Left Eye Distance:   Bilateral Distance:    Right Eye Near:   Left Eye Near:    Bilateral Near:     Physical Exam Vitals signs and nursing note reviewed.  Constitutional:      Appearance: He is well-developed. He is ill-appearing.  HENT:     Head: Normocephalic and atraumatic.  Eyes:     Conjunctiva/sclera: Conjunctivae normal.  Neck:     Musculoskeletal: Neck supple.  Cardiovascular:     Rate and Rhythm: Normal rate and regular rhythm.     Heart sounds: No murmur.  Pulmonary:     Effort: Pulmonary effort is normal. No respiratory distress.     Breath sounds: Normal breath sounds.  Abdominal:     Palpations: Abdomen is soft.     Tenderness: There is no abdominal tenderness. There is no guarding or rebound.  Musculoskeletal:     Comments: Muscle cramping visible in forearms and  abdomen.    Skin:    General: Skin is warm and dry.     Findings: No rash.  Neurological:     Mental Status: He is alert.      UC Treatments / Results  Labs (all labs ordered are listed, but only abnormal results are displayed) Labs Reviewed - No data to display  EKG   Radiology No results found.  Procedures Procedures (including critical care time)  Medications Ordered in UC Medications - No data to display  Initial Impression / Assessment and Plan / UC Course  I have reviewed the triage vital signs and the nursing notes.  Pertinent labs & imaging results that were available during my care of the patient were reviewed by me and considered in my medical decision making (see chart for details).   Vomiting, severe muscle cramping.  Patient is tachycardic and in acute pain due to his muscle cramping.  He states he is unable to keep down foods or liquids.  Sending patient to the emergency department for additional evaluation.     Final Clinical Impressions(s) / UC Diagnoses   Final diagnoses:  Nausea and vomiting, intractability of vomiting not specified, unspecified vomiting type  Muscle cramping     Discharge Instructions     Go to the emergency department for your severe muscle cramping and multiple episodes of vomiting today.        ED Prescriptions    None     Controlled Substance Prescriptions [place] Controlled Substance Registry consulted? Not Applicable   Mickie Bailate, Brieanna Nau H, NP 11/24/18 Ernestina [id number]

## 2018-11-24 NOTE — ED Triage Notes (Signed)
Pt sent here from UC for further evaluation of emeis and full body cramps for the past 2 days, denies any blood in urine. 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

## 2018-11-24 NOTE — ED Triage Notes (Signed)
Pt sts vomiting and body aches x 3 days; pt sts feels like generalized muscle cramping; denies diarrhea

## 2018-11-24 NOTE — ED Provider Notes (Addendum)
[facility] EMERGENCY [facility]MENT Provider Note   CSN: [id number] Arrival date & time: 11/24/18  1929    History   Chief Complaint Chief Complaint  Patient presents with  . Emesis  . Generalized Body Aches    HPI Langston Gaetana MichaelisDarnell Anschutz is a 43 y.o. male.     HPI Patient presents from urgent care clinic for evaluation of nonbloody nonbilious emesis and cramps for the last 2 days.  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  Endorses drinking multiple bottles of water and Gatorade daily.  Yesterday had around 10 episodes of emesis and additional 8 episodes today.  No diarrhea.  Has only urinated twice today.  Also endorses chronic shortness of breath that has been worse over the last few weeks.  Attributes this to heavy smoking.  Is down to a little more than a pack a day.  No cough.   History reviewed. No pertinent past medical history.  Patient Active Problem List   Diagnosis Date Noted  . ARF (acute renal failure) (HCC) 11/24/2018  . Nausea & vomiting 11/24/2018  . Benign essential HTN 11/24/2018  . Leukocytosis 11/24/2018    History reviewed. No pertinent surgical history.      Home Medications    Prior to Admission medications   Medication Sig Start Date End Date Taking? Authorizing Provider  acetaminophen (TYLENOL) 650 MG CR tablet Take 650 mg by mouth every 8 (eight) hours as needed for pain.   Yes [provider]  meclizine (DRAMAMINE II) 25 MG tablet Take 25-50 mg by mouth 3 (three) times daily as needed for nausea.   Yes [provider]  meloxicam (MOBIC) 15 MG tablet Take 1 tablet (15 mg total) by mouth daily. Patient not taking: Reported on 11/24/2018 11/16/16   Trena PlattEnglish, Stephanie D, PA  predniSONE (DELTASONE) 20 MG tablet Take 3 PO QAM x2days, 2 PO QAM x2days, 1 PO QAM x3days Patient not taking: Reported on 11/24/2018 11/16/16   Trena PlattEnglish,  Stephanie D, PA  tiZANidine (ZANAFLEX) 4 MG tablet Take 1 tablet (4 mg total) by mouth every 8 (eight) hours as needed for muscle spasms. Patient not taking: Reported on 11/24/2018 12/15/16   Kathryne HitchBlackman, Christopher Y, MD  traMADol (ULTRAM) 50 MG tablet Take 1-2 tablets (50-100 mg total) by mouth every 8 (eight) hours as needed. Patient not taking: Reported on 11/24/2018 11/16/16   Garnetta BuddyEnglish, Stephanie D, PA    Family History No family history on file.  Social History Social History   Tobacco Use  . Smoking status: Current Every Day Smoker    Packs/day: 3.00    Years: 0.20    Pack years: 0.60    Types: Cigars  . Smokeless tobacco: Never Used  Substance Use Topics  . Alcohol use: Yes    Comment: weekends  . Drug use: Not on file     Allergies   Patient has no known allergies.   Review of Systems Review of Systems  Constitutional: Positive for chills. Negative for fever.  HENT: Negative for ear pain and sore throat.   Eyes: Negative for pain and visual disturbance.  Respiratory: Positive for shortness of breath. Negative for cough.   Cardiovascular: Negative for chest pain and palpitations.  Gastrointestinal: Negative for abdominal pain and vomiting.  Genitourinary: Positive for decreased urine volume. Negative for dysuria and hematuria.  Musculoskeletal: Negative for arthralgias and back pain.  Persistent cramps  Skin: Negative for color change and rash.  Neurological: Negative for seizures and syncope.  All other systems reviewed and are negative.    Physical Exam Updated Vital Signs BP (!) 143/106 (BP Location: Left Arm)   Pulse 94   Temp 98.1 F (36.7 C) (Oral)   Resp 16   SpO2 93%   Physical Exam Vitals signs and nursing note reviewed.  Constitutional:      General: He is not in acute distress.    Appearance: He is well-developed. He is ill-appearing. He is not toxic-appearing.  HENT:     Head: Normocephalic and atraumatic.  Eyes:      Conjunctiva/sclera: Conjunctivae normal.  Neck:     Musculoskeletal: Neck supple.  Cardiovascular:     Rate and Rhythm: Regular rhythm. Tachycardia present.     Heart sounds: No murmur. Gallop present.   Pulmonary:     Effort: Pulmonary effort is normal. No respiratory distress.     Breath sounds: Normal breath sounds.  Abdominal:     Palpations: Abdomen is soft.     Tenderness: There is no abdominal tenderness.  Musculoskeletal:     Right lower leg: No edema.     Left lower leg: No edema.  Skin:    General: Skin is warm and dry.  Neurological:     Mental Status: He is alert and oriented to person, place, and time.  Psychiatric:        Mood and Affect: Mood normal.      ED Treatments / Results  Labs (all labs ordered are listed, but only abnormal results are displayed) Labs Reviewed  COMPREHENSIVE METABOLIC PANEL - Abnormal; Notable for the following components:      Result Value   Chloride 95 (*)    CO2 21 (*)    BUN 36 (*)    Creatinine, Ser 3.01 (*)    Calcium 10.9 (*)    Total Protein 9.8 (*)    Albumin 5.7 (*)    AST 46 (*)    Total Bilirubin 1.7 (*)    GFR calc non Af Amer 24 (*)    GFR calc Af Amer 28 (*)    Anion gap 19 (*)    All other components within normal limits  CBC - Abnormal; Notable for the following components:   WBC 13.0 (*)    RBC 6.83 (*)    MCV 70.1 (*)    MCH 23.4 (*)    RDW 17.7 (*)    All other components within normal limits  URINALYSIS, ROUTINE W REFLEX MICROSCOPIC - Abnormal; Notable for the following components:   Color, Urine AMBER (*)    APPearance CLOUDY (*)    Hgb urine dipstick SMALL (*)    Ketones, ur 5 (*)    Protein, ur 100 (*)    Leukocytes,Ua TRACE (*)    All other components within normal limits  CK - Abnormal; Notable for the following components:   Total CK 1,052 (*)    All other components within normal limits  SARS CORONAVIRUS 2 (HOSPITAL ORDER, [place] [facility])  LIPASE, BLOOD  MAGNESIUM   RAPID URINE DRUG SCREEN, HOSP PERFORMED    EKG None  Radiology No results found.  Procedures Procedures (including critical care time)  Medications Ordered in ED Medications  sodium chloride flush (NS) 0.9 % injection 3 mL (3 mLs Intravenous Not Given 11/24/18 2037)  ondansetron (ZOFRAN-ODT) disintegrating tablet 4 mg (4 mg Oral Given 11/24/18  2010)  lactated ringers bolus 1,000 mL (1,000 mLs Intravenous New Bag/Given 11/24/18 2156)  lactated ringers bolus 1,000 mL (1,000 mLs Intravenous New Bag/Given 11/24/18 2207)     Initial Impression / Assessment and Plan / ED Course  I have reviewed the triage vital signs and the nursing notes.  Pertinent labs & imaging results that were available during my care of the patient were reviewed by me and considered in my medical decision making (see chart for details).        Mr. Shon Houghruesdale is a 43 year old male with no chronic medical problems.  I reviewed his medical history and records.  He does not take any regular medications.  His chief complaint today is nausea, vomiting, and muscle cramps.  Ultimately found to have AKI with creatinine over 3.  BUN: Creatinine ratio around 10-1, making prerenal less likely but I suspect the etiology was dehydration with his new job.  May have been a component of heat exhaustion given the emesis as well.  Mild hypochloremia at 95 and mildly decreased bicarb at 21 with anion gap of 19.  Normal sodium.  No significant anemia.  Mild leukocytosis at 13.  Normal magnesium.  His UA was notable for amber color with small hemoglobin and protein proteinuria.  CK elevated but not to rhabdomyolysis levels.  We do not have records of previous creatinine, given that he has never had kidney problems before and is otherwise healthy I think this is acute kidney injury and with ongoing nausea and emesis, he meets criteria for inpatient treatment and continued trending until improves enough for discharge.  I initiated treatment  with 2L crystalloid for rehydration.  Reviewed with hospitalist service who is in agreement.  Patient was subsequently admitted.  This patient was seen in conjunction with Dr. Patria Maneampos.  Final Clinical Impressions(s) / ED Diagnoses   Final diagnoses:  Dehydration  AKI (acute kidney injury) [facility](HCC)    ED Discharge Orders    None         Jaclynn MajorStarnes, Tya Haughey, MD 11/24/18 2308    Azalia Bilisampos, Kevin, MD 11/25/18 2241

## 2018-11-24 NOTE — ED Notes (Signed)
Pts bladder scan showed 0 mL of urine in the bladder

## 2018-11-24 NOTE — ED Notes (Signed)
ED TO INPATIENT HANDOFF REPORT  ED Nurse Name and Phone #: [phone]  S Name/Age/Gender Jose Shannon 43 y.o. male Room/Bed: 041C/041C  Code Status   Code Status: Not on file  Home/SNF/Other Home Patient oriented to: self, place, time and situation Is this baseline? Yes   Triage Complete: Triage complete  Chief Complaint Muscle Cramps/Vomiting  Triage Note Pt sent here from UC for further evaluation of emeis and full body cramps for the past 2 days, denies any blood in urine. States he just started a new job yesterday working with [employer] and has had these symptoms ever since.    Allergies No Known Allergies  Level of Care/Admitting Diagnosis ED Disposition    ED Disposition Condition Comment   Admit  [facility] [[id number]]  Level of Care: Telemetry Medical [104]  Covid Evaluation: Asymptomatic Screening Protocol (No Symptoms)  Diagnosis: ARF (acute renal failure) (HCC) [130865][[id number]]  Admitting Physician: Rometta EmeryGARBA, MOHAMMAD L [[id number]]  Attending Physician: Rometta EmeryGARBA, MOHAMMAD L [[id number]]  Estimated length of stay: past midnight tomorrow  Certification:: I certify this patient will need inpatient services for at least 2 midnights  PT Class (Do Not Modify): Inpatient [101]  PT Acc Code (Do Not Modify): Private [1]       B Medical/Surgery History History reviewed. No pertinent past medical history. History reviewed. No pertinent surgical history.   A IV Location/Drains/Wounds Patient Lines/Drains/Airways Status   Active Line/Drains/Airways    Name:   Placement date:   Placement time:   Site:   Days:   Peripheral IV 11/24/18 Right Hand   11/24/18    2146    Hand   less than 1          Intake/Output Last 24 hours No intake or output data in the 24 hours ending 11/24/18 2252  Labs/Imaging Results for orders placed or performed during the hospital encounter of 11/24/18 (from the past 48 hour(s))  Lipase, blood     Status:  None   Collection Time: 11/24/18  8:33 PM  Result Value Ref Range   Lipase 25 11 - 51 U/L    Comment: Performed at [facility], [address] [place], [place] [address]  Comprehensive metabolic panel     Status: Abnormal   Collection Time: 11/24/18  8:33 PM  Result Value Ref Range   Sodium 135 135 - 145 mmol/L   Potassium 4.2 3.5 - 5.1 mmol/L   Chloride 95 (L) 98 - 111 mmol/L   CO2 21 (L) 22 - 32 mmol/L   Glucose, Bld 93 70 - 99 mg/dL   BUN 36 (H) 6 - 20 mg/dL   Creatinine, Ser 6.293.01 (H) 0.61 - 1.24 mg/dL   Calcium 52.810.9 (H) 8.9 - 10.3 mg/dL   Total Protein 9.8 (H) 6.5 - 8.1 g/dL   Albumin 5.7 (H) 3.5 - 5.0 g/dL   AST 46 (H) 15 - 41 U/L   ALT 23 0 - 44 U/L   Alkaline Phosphatase 102 38 - 126 U/L   Total Bilirubin 1.7 (H) 0.3 - 1.2 mg/dL   GFR calc non Af Amer 24 (L) >60 mL/min   GFR calc Af Amer 28 (L) >60 mL/min   Anion gap 19 (H) 5 - 15    Comment: Performed at [facility], [address] [place], [place] [address]  CBC     Status: Abnormal   Collection Time: 11/24/18  8:33 PM  Result Value Ref Range  WBC 13.0 (H) 4.0 - 10.5 K/uL   RBC 6.83 (H) 4.22 - 5.81 MIL/uL   Hemoglobin 16.0 13.0 - 17.0 g/dL   HCT 96.047.9 45.439.0 - 09.852.0 %   MCV 70.1 (L) 80.0 - 100.0 fL   MCH 23.4 (L) 26.0 - 34.0 pg   MCHC 33.4 30.0 - 36.0 g/dL   RDW 11.917.7 (H) 14.711.5 - 82.915.5 %   Platelets 283 150 - 400 K/uL   nRBC 0.2 0.0 - 0.2 %    Comment: Performed at [facility], [address] [place], [place] [address]  Urinalysis, Routine w reflex microscopic     Status: Abnormal   Collection Time: 11/24/18  8:53 PM  Result Value Ref Range   Color, Urine AMBER (A) YELLOW    Comment: BIOCHEMICALS MAY BE AFFECTED BY COLOR   APPearance CLOUDY (A) CLEAR   Specific Gravity, Urine 1.026 1.005 - 1.030   pH 5.0 5.0 - 8.0   Glucose, UA NEGATIVE NEGATIVE mg/dL   Hgb urine dipstick SMALL (A) NEGATIVE   Bilirubin Urine NEGATIVE NEGATIVE   Ketones, ur 5 (A) NEGATIVE mg/dL   Protein, ur 086100 (A)  NEGATIVE mg/dL   Nitrite NEGATIVE NEGATIVE   Leukocytes,Ua TRACE (A) NEGATIVE   RBC / HPF 0-5 0 - 5 RBC/hpf   WBC, UA 6-10 0 - 5 WBC/hpf   Bacteria, UA NONE SEEN NONE SEEN   Squamous Epithelial / LPF 0-5 0 - 5   Mucus PRESENT    Hyaline Casts, UA PRESENT     Comment: Performed at [facility], [address] [place], [place] [address]  Magnesium     Status: None   Collection Time: 11/24/18  9:23 PM  Result Value Ref Range   Magnesium 2.4 1.7 - 2.4 mg/dL    Comment: Performed at [facility], [address] [place], [place] [address]  CK     Status: Abnormal   Collection Time: 11/24/18  9:23 PM  Result Value Ref Range   Total CK 1,052 (H) 49 - 397 U/L    Comment: Performed at [facility], [address] [place], [place] [address]   No results found.  Pending Labs Unresulted Labs (From admission, onward)    Start     Ordered   11/24/18 2247  Urine rapid drug screen (hosp performed)  ONCE - STAT,   STAT     11/24/18 2246   11/24/18 2149  SARS Coronavirus 2 [facility](Hospital order, Performed in [facility] lab)  Once,   STAT     11/24/18 2149   Signed and Held  HIV antibody (Routine Testing)  Once,   R     Signed and Held   Signed and Held  CBC  (heparin)  Once,   R    Comments: Baseline for heparin therapy IF NOT ALREADY DRAWN.  Notify MD if PLT < 100 K.    Signed and Held   Signed and Held  Creatinine, serum  (heparin)  Once,   R    Comments: Baseline for heparin therapy IF NOT ALREADY DRAWN.    Signed and Held   Signed and Held  Comprehensive metabolic panel  Tomorrow morning,   R     Signed and Held   Signed and Held  CBC  Tomorrow morning,   R     Signed and Held          Vitals/Pain Today's Vitals   11/24/18 1941 11/24/18  2200  BP: (!) 141/111 (!) 143/106  Pulse: (!) 113 94  Resp: 18 16  Temp: 98.1 F (36.7 C)   TempSrc: Oral   SpO2: 98% 93%    Isolation Precautions No active isolations  Medications Medications  sodium  chloride flush (NS) 0.9 % injection 3 mL (3 mLs Intravenous Not Given 11/24/18 2037)  ondansetron (ZOFRAN-ODT) disintegrating tablet 4 mg (4 mg Oral Given 11/24/18 2010)  lactated ringers bolus 1,000 mL (1,000 mLs Intravenous New Bag/Given 11/24/18 2156)  lactated ringers bolus 1,000 mL (1,000 mLs Intravenous New Bag/Given 11/24/18 2207)    Mobility walks Low fall risk   Focused Assessments     R Recommendations: See Admitting Provider Note  Report given to:   Additional Notes:

## 2018-11-24 NOTE — Discharge Instructions (Addendum)
Go to the emergency department for your severe muscle cramping and multiple episodes of vomiting today.

## 2018-11-25 DIAGNOSIS — I10 Essential (primary) hypertension: Secondary | ICD-10-CM | POA: Diagnosis not present

## 2018-11-25 DIAGNOSIS — R112 Nausea with vomiting, unspecified: Secondary | ICD-10-CM | POA: Diagnosis not present

## 2018-11-25 DIAGNOSIS — D72829 Elevated white blood cell count, unspecified: Secondary | ICD-10-CM | POA: Diagnosis not present

## 2018-11-25 DIAGNOSIS — N179 Acute kidney failure, unspecified: Secondary | ICD-10-CM | POA: Diagnosis not present

## 2018-11-25 LAB — COMPREHENSIVE METABOLIC PANEL
ALT: 19 U/L (ref 0–44)
AST: 40 U/L (ref 15–41)
Albumin: 4.3 g/dL (ref 3.5–5.0)
Alkaline Phosphatase: 75 U/L (ref 38–126)
Anion gap: 11 (ref 5–15)
BUN: 32 mg/dL — ABNORMAL HIGH (ref 6–20)
CO2: 24 mmol/L (ref 22–32)
Calcium: 9.7 mg/dL (ref 8.9–10.3)
Chloride: 100 mmol/L (ref 98–111)
Creatinine, Ser: 1.93 mg/dL — ABNORMAL HIGH (ref 0.61–1.24)
GFR calc Af Amer: 48 mL/min — ABNORMAL LOW (ref >60)
GFR calc non Af Amer: 42 mL/min — ABNORMAL LOW (ref >60)
Glucose, Bld: 111 mg/dL — ABNORMAL HIGH (ref 70–99)
Potassium: 3.8 mmol/L (ref 3.5–5.1)
Sodium: 135 mmol/L (ref 135–145)
Total Bilirubin: 1.2 mg/dL (ref 0.3–1.2)
Total Protein: 7.6 g/dL (ref 6.5–8.1)

## 2018-11-25 LAB — CBC
HCT: 40.8 % (ref 39.0–52.0)
Hemoglobin: 13.7 g/dL (ref 13.0–17.0)
MCH: 23.4 pg — ABNORMAL LOW (ref 26.0–34.0)
MCHC: 33.6 g/dL (ref 30.0–36.0)
MCV: 69.7 fL — ABNORMAL LOW (ref 80.0–100.0)
Platelets: 212 10*3/uL (ref 150–400)
RBC: 5.85 MIL/uL — ABNORMAL HIGH (ref 4.22–5.81)
RDW: 15.4 % (ref 11.5–15.5)
WBC: 9.7 10*3/uL (ref 4.0–10.5)
nRBC: 0.2 % (ref 0.0–0.2)

## 2018-11-25 LAB — RAPID URINE DRUG SCREEN, HOSP PERFORMED
Amphetamines: NOT DETECTED
Barbiturates: NOT DETECTED
Benzodiazepines: NOT DETECTED
Cocaine: NOT DETECTED
Opiates: NOT DETECTED
Tetrahydrocannabinol: POSITIVE — AB

## 2018-11-25 LAB — HIV ANTIBODY (ROUTINE TESTING W REFLEX): HIV Screen 4th Generation wRfx: NONREACTIVE

## 2018-11-25 MED ORDER — ONDANSETRON HCL 4 MG/2ML IJ SOLN: 4.0000 mg | Freq: Four times a day (QID) | INTRAMUSCULAR | Status: DC | PRN

## 2018-11-25 MED ORDER — DEXTROSE-NACL 5-0.9 % IV SOLN
INTRAVENOUS | Status: DC
  Administered 2018-11-25 – 2018-11-26 (×4): via INTRAVENOUS

## 2018-11-25 MED ORDER — HYDRALAZINE HCL 20 MG/ML IJ SOLN: 5.0000 mg | INTRAMUSCULAR | Status: DC | PRN

## 2018-11-25 MED ORDER — ACETAMINOPHEN 325 MG PO TABS
650.0000 mg | ORAL_TABLET | Freq: Four times a day (QID) | ORAL | Status: DC | PRN
  Administered 2018-11-25 (×2): 650 mg via ORAL
  Filled 2018-11-25 (×2): qty 2

## 2018-11-25 MED ORDER — ONDANSETRON HCL 4 MG PO TABS: 4.0000 mg | ORAL_TABLET | Freq: Four times a day (QID) | ORAL | Status: DC | PRN

## 2018-11-25 MED ORDER — HEPARIN SODIUM (PORCINE) 5000 UNIT/ML IJ SOLN
5000.0000 [IU] | Freq: Three times a day (TID) | INTRAMUSCULAR | Status: DC
  Administered 2018-11-25 (×2): 5000 [IU] via SUBCUTANEOUS
  Filled 2018-11-25 (×2): qty 1

## 2018-11-25 NOTE — Plan of Care (Signed)

## 2018-11-25 NOTE — Plan of Care (Signed)
Patient admitted for dehydration, N/V no noted nausea or vomiting since admission to floor.  Patient tolerated crackers, jello with no difficulty.  Will continue to monitor closely

## 2018-11-25 NOTE — Progress Notes (Signed)
Patient ID: Jose ScarletRashaan Darnell Shannon, male   DOB: 1975/10/26, 43 y.o.   MRN: [id number]  PROGRESS NOTE    Jose Shannon  JXB:[id number] DOB: 1975/10/26 DOA: 11/24/2018 PCP: Doristine BosworthStallings, Zoe A, MD   Brief Narrative:  43 year old male with history of degenerative disc disease presented on 11/24/2018 with nausea, vomiting and muscle cramps.  His creatinine was found to be 3.01.  Initial COVID testing was negative.  He was started on IV fluids.  Assessment & Plan:   Acute kidney injury/acute renal failure -Most likely prerenal. -Presented with creatinine of 3.01.  No prior records.  This morning is 1.93. -We will continue IV fluids.  Encourage oral hydration if able to tolerate.  Nausea and vomiting -Questionable cause.  COVID-19 testing negative. -Improving.  Continue antiemetics as needed.  Still slightly nauseous.  We will gradually advance diet. -If symptoms do not resolve, might need CT of the abdomen  Hypertension  -Blood pressure elevated.  Will use IV hydralazine as needed.  Monitor  Leukocytosis -Probably reactive.  Resolved   DVT prophylaxis: Heparin Code Status: Full Family Communication: Spoke to patient at bedside Disposition Plan: Home tomorrow if symptoms improve.  Consultants: None  Procedures: None  Antimicrobials: None   Subjective: Patient seen and examined at bedside.  He feels slightly better.  Still slightly nauseous.  No overnight fevers.  Complains of mild abdominal pain.  No diarrhea.  Objective: Vitals:   11/24/18 2200 11/24/18 2300 11/25/18 0014 11/25/18 0552  BP: (!) 143/106 (!) 150/105 (!) 150/102 (!) 130/91  Pulse: 94 84 87 79  Resp: 16     Temp:   98.4 F (36.9 C) 98.3 F (36.8 C)  TempSrc:   Oral Oral  SpO2: 93% 97% 97% 97%    Intake/Output Summary (Last 24 hours) at 11/25/2018 1057 Last data filed at 11/25/2018 1046 Gross per 24 hour  Intake 3348.63 ml  Output 0 ml  Net 3348.63 ml   There were no vitals filed for this  visit.  Examination:  General exam: Appears calm and comfortable  Respiratory system: Bilateral decreased breath sounds at bases Cardiovascular system: S1 & S2 heard, Rate controlled Gastrointestinal system: Abdomen is nondistended, soft and nontender. Normal bowel sounds heard. Extremities: No cyanosis, clubbing, edema    Data Reviewed: I have personally reviewed following labs and imaging studies  CBC: Recent Labs  Lab 11/24/18 2033 11/25/18 0250  WBC 13.0* 9.7  HGB 16.0 13.7  HCT 47.9 40.8  MCV 70.1* 69.7*  PLT 283 212   Basic Metabolic Panel: Recent Labs  Lab 11/24/18 2033 11/24/18 2123 11/25/18 0250  NA 135  --  135  K 4.2  --  3.8  CL 95*  --  100  CO2 21*  --  24  GLUCOSE 93  --  111*  BUN 36*  --  32*  CREATININE 3.01*  --  1.93*  CALCIUM 10.9*  --  9.7  MG  --  2.4  --    GFR: CrCl cannot be calculated (Unknown ideal weight.). Liver Function Tests: Recent Labs  Lab 11/24/18 2033 11/25/18 0250  AST 46* 40  ALT 23 19  ALKPHOS 102 75  BILITOT 1.7* 1.2  PROT 9.8* 7.6  ALBUMIN 5.7* 4.3   Recent Labs  Lab 11/24/18 2033  LIPASE 25   No results for input(s): AMMONIA in the last 168 hours. Coagulation Profile: No results for input(s): INR, PROTIME in the last 168 hours. Cardiac Enzymes: Recent Labs  Lab 11/24/18 2123  CKTOTAL 1,052*   BNP (last 3 results) No results for input(s): PROBNP in the last 8760 hours. HbA1C: No results for input(s): HGBA1C in the last 72 hours. CBG: No results for input(s): GLUCAP in the last 168 hours. Lipid Profile: No results for input(s): CHOL, HDL, LDLCALC, TRIG, CHOLHDL, LDLDIRECT in the last 72 hours. Thyroid Function Tests: No results for input(s): TSH, T4TOTAL, FREET4, T3FREE, THYROIDAB in the last 72 hours. Anemia Panel: No results for input(s): VITAMINB12, FOLATE, FERRITIN, TIBC, IRON, RETICCTPCT in the last 72 hours. Sepsis Labs: No results for input(s): PROCALCITON, LATICACIDVEN in the last 168  hours.  Recent Results (from the past 240 hour(s))  SARS Coronavirus 2 [facility](Hospital order, Performed in [facility] lab)     Status: None   Collection Time: 11/24/18  9:49 PM  Result Value Ref Range Status   SARS Coronavirus 2 NEGATIVE NEGATIVE Final    Comment: (NOTE) If result is NEGATIVE SARS-CoV-2 target nucleic acids are NOT DETECTED. The SARS-CoV-2 RNA is generally detectable in upper and lower  respiratory specimens during the acute phase of infection. The lowest  concentration of SARS-CoV-2 viral copies this assay can detect is 250  copies / mL. A negative result does not preclude SARS-CoV-2 infection  and should not be used as the sole basis for treatment or other  patient management decisions.  A negative result may occur with  improper specimen collection / handling, submission of specimen other  than nasopharyngeal swab, presence of viral mutation(s) within the  areas targeted by this assay, and inadequate number of viral copies  (<250 copies / mL). A negative result must be combined with clinical  observations, patient history, and epidemiological information. If result is POSITIVE SARS-CoV-2 target nucleic acids are DETECTED. The SARS-CoV-2 RNA is generally detectable in upper and lower  respiratory specimens dur ing the acute phase of infection.  Positive  results are indicative of active infection with SARS-CoV-2.  Clinical  correlation with patient history and other diagnostic information is  necessary to determine patient infection status.  Positive results do  not rule out bacterial infection or co-infection with other viruses. If result is PRESUMPTIVE POSTIVE SARS-CoV-2 nucleic acids MAY BE PRESENT.   A presumptive positive result was obtained on the submitted specimen  and confirmed on repeat testing.  While 2019 novel coronavirus  (SARS-CoV-2) nucleic acids may be present in the submitted sample  additional confirmatory testing may be necessary for  epidemiological  and / or clinical management purposes  to differentiate between  SARS-CoV-2 and other Sarbecovirus currently known to infect humans.  If clinically indicated additional testing with an alternate test  methodology [phone]([id number]) is advised. The SARS-CoV-2 RNA is generally  detectable in upper and lower respiratory sp ecimens during the acute  phase of infection. The expected result is Negative. Fact Sheet for Patients:  BoilerBrush.com.cyhttps://www.fda.gov/media/136312/download Fact Sheet for Healthcare Providers: https://pope.com/https://www.fda.gov/media/136313/download This test is not yet approved or cleared by the [place] FDA and has been authorized for detection and/or diagnosis of SARS-CoV-2 by FDA under an Emergency Use Authorization (EUA).  This EUA will remain in effect (meaning this test can be used) for the duration of the COVID-19 declaration under Section 564(b)(1) of the Act, 21 U.S.C. section 360bbb-3(b)(1), unless the authorization is terminated or revoked sooner. Performed at [facility], [address] [place], [place] [address]          Radiology Studies: No results found.      Scheduled Meds: . heparin  5,000 Units Subcutaneous Q8H  . sodium chloride flush  3 mL Intravenous Once   Continuous Infusions: . dextrose 5 % and 0.9% NaCl 125 mL/hr at 11/25/18 0837     LOS: 1 day        Aline August, MD [facility] 11/25/2018, 10:57 AM

## 2018-11-25 NOTE — Progress Notes (Signed)
Received patient to floor, complaints of HA at this time, no nausea.  BP checked in both arms, pt reports no history of BP issues.  Fiancee at bedside at this time, discussed hospital policy concerning visitors during [place].  Patient oriented to room, bed in lowest position call bell and urinal placed within reach.  See flowsheet for full assessment.  No complaints of nausea at this time, pt asking for food.

## 2018-11-26 DIAGNOSIS — I10 Essential (primary) hypertension: Secondary | ICD-10-CM | POA: Diagnosis not present

## 2018-11-26 DIAGNOSIS — D72829 Elevated white blood cell count, unspecified: Secondary | ICD-10-CM | POA: Diagnosis not present

## 2018-11-26 DIAGNOSIS — R112 Nausea with vomiting, unspecified: Secondary | ICD-10-CM | POA: Diagnosis not present

## 2018-11-26 DIAGNOSIS — N179 Acute kidney failure, unspecified: Secondary | ICD-10-CM | POA: Diagnosis not present

## 2018-11-26 LAB — BASIC METABOLIC PANEL
Anion gap: 7 (ref 5–15)
BUN: 11 mg/dL (ref 6–20)
CO2: 23 mmol/L (ref 22–32)
Calcium: 9 mg/dL (ref 8.9–10.3)
Chloride: 107 mmol/L (ref 98–111)
Creatinine, Ser: 0.91 mg/dL (ref 0.61–1.24)
GFR calc Af Amer: >60 mL/min (ref >60)
GFR calc non Af Amer: >60 mL/min (ref >60)
Glucose, Bld: 126 mg/dL — ABNORMAL HIGH (ref 70–99)
Potassium: 4 mmol/L (ref 3.5–5.1)
Sodium: 137 mmol/L (ref 135–145)

## 2018-11-26 LAB — CK: Total CK: 635 U/L — ABNORMAL HIGH (ref 49–397)

## 2018-11-26 MED ORDER — AMLODIPINE BESYLATE 5 MG PO TABS
5.0000 mg | ORAL_TABLET | Freq: Every day | ORAL | Status: DC
  Administered 2018-11-26: 11:00:00 5 mg via ORAL
  Filled 2018-11-26: qty 1

## 2018-11-26 MED ORDER — AMLODIPINE BESYLATE 5 MG PO TABS: 5.0000 mg | ORAL_TABLET | Freq: Every day | ORAL | 0 refills | Status: DC

## 2018-11-26 MED ORDER — ONDANSETRON HCL 4 MG PO TABS: 4.0000 mg | ORAL_TABLET | Freq: Four times a day (QID) | ORAL | 0 refills | Status: DC | PRN

## 2018-11-26 NOTE — Discharge Summary (Signed)
Physician Discharge Summary  Eden EmmsRashaan Darnell Shon [place] ZOX:[id number] DOB: Oct 12, 1975 DOA: 11/24/2018  PCP: Doristine BosworthStallings, Zoe A, MD  Admit date: 11/24/2018 Discharge date: 11/26/2018  Admitted From: Home Disposition: Home  Recommendations for Outpatient Follow-up:  1. Follow up with PCP in 1 week with repeat BMP 2. Follow up in ED if symptoms worsen or new appear   Home Health: No Equipment/Devices: None  Discharge Condition: Stable CODE STATUS: Full Diet recommendation: Heart healthy  Brief/Interim Summary: 43 year old male with history of degenerative disc disease presented on 11/24/2018 with nausea, vomiting and muscle cramps.  His creatinine was found to be 3.01.  Initial COVID testing was negative.  He was started on IV fluids.  His condition has improved in the hospital has not.  His creatinine is 0.91 today.  He is tolerating diet.  He will be discharged home today.  Discharge Diagnoses:   Acute kidney injury/acute renal failure -Most likely prerenal. -Presented with creatinine of 3.01.  No prior records.  This morning is 0.91 -Treated with IV fluids. -Tolerating diet.  Discharge home today.  Outpatient follow-up of BMP.  Nausea and vomiting -Questionable cause.  COVID-19 testing negative. -Improving.  Continue antiemetics as needed.   -Resolved.  Tolerating diet.  Hypertension  -Blood pressure elevated.    Will start amlodipine 5 mg daily.  Outpatient follow-up.  Leukocytosis -Probably reactive.  Resolved  Marijuana use -Urine drug screen positive for marijuana.  Patient needs to avoid marijuana.   Discharge Instructions  Discharge Instructions    Diet - low sodium heart healthy   Complete by: As directed    Increase activity slowly   Complete by: As directed      Allergies as of 11/26/2018   No Known Allergies     Medication List    STOP taking these medications   meloxicam 15 MG tablet Commonly known as: MOBIC   predniSONE 20 MG tablet Commonly  known as: DELTASONE   tiZANidine 4 MG tablet Commonly known as: Zanaflex   traMADol 50 MG tablet Commonly known as: ULTRAM     TAKE these medications   acetaminophen 650 MG CR tablet Commonly known as: TYLENOL Take 650 mg by mouth every 8 (eight) hours as needed for pain.   amLODipine 5 MG tablet Commonly known as: NORVASC Take 1 tablet (5 mg total) by mouth daily. Start taking on: November 27, 2018   DRAMAMINE II 25 MG tablet Generic drug: meclizine Take 25-50 mg by mouth 3 (three) times daily as needed for nausea.   ondansetron 4 MG tablet Commonly known as: ZOFRAN Take 1 tablet (4 mg total) by mouth every 6 (six) hours as needed for nausea.      Follow-up Information    Doristine BosworthStallings, Zoe A, MD. Schedule an appointment as soon as possible for a visit in 1 week(s).   Specialty: Internal Medicine Why: With repeat BMP Contact information: [address] [place] [address] [phone]          No Known Allergies  Consultations:  None   Procedures/Studies:  No results found.    Subjective: Patient seen and examined at bedside.  He feels much better.  He is tolerating diet.  He is okay to go home.  Discharge Exam: Vitals:   11/25/18 2214 11/26/18 0600  BP: (!) 139/101 (!) 133/94  Pulse: 77 83  Resp: 14 16  Temp: 98.1 F (36.7 C) 98.1 F (36.7 C)  SpO2: 99% 99%    General: Pt is alert, awake, not in acute  distress Cardiovascular: rate controlled, S1/S2 + Respiratory: bilateral decreased breath sounds at bases Abdominal: Soft, NT, ND, bowel sounds + Extremities: no edema, no cyanosis    The results of significant diagnostics from this hospitalization (including imaging, microbiology, ancillary and laboratory) are listed below for reference.     Microbiology: Recent Results (from the past 240 hour(s))  SARS Coronavirus 2 [facility](Hospital order, Performed in [facility] hospital lab)     Status: None   Collection Time: 11/24/18  9:49 PM  Result Value  Ref Range Status   SARS Coronavirus 2 NEGATIVE NEGATIVE Final    Comment: (NOTE) If result is NEGATIVE SARS-CoV-2 target nucleic acids are NOT DETECTED. The SARS-CoV-2 RNA is generally detectable in upper and lower  respiratory specimens during the acute phase of infection. The lowest  concentration of SARS-CoV-2 viral copies this assay can detect is 250  copies / mL. A negative result does not preclude SARS-CoV-2 infection  and should not be used as the sole basis for treatment or other  patient management decisions.  A negative result may occur with  improper specimen collection / handling, submission of specimen other  than nasopharyngeal swab, presence of viral mutation(s) within the  areas targeted by this assay, and inadequate number of viral copies  (<250 copies / mL). A negative result must be combined with clinical  observations, patient history, and epidemiological information. If result is POSITIVE SARS-CoV-2 target nucleic acids are DETECTED. The SARS-CoV-2 RNA is generally detectable in upper and lower  respiratory specimens dur ing the acute phase of infection.  Positive  results are indicative of active infection with SARS-CoV-2.  Clinical  correlation with patient history and other diagnostic information is  necessary to determine patient infection status.  Positive results do  not rule out bacterial infection or co-infection with other viruses. If result is PRESUMPTIVE POSTIVE SARS-CoV-2 nucleic acids MAY BE PRESENT.   A presumptive positive result was obtained on the submitted specimen  and confirmed on repeat testing.  While 2019 novel coronavirus  (SARS-CoV-2) nucleic acids may be present in the submitted sample  additional confirmatory testing may be necessary for epidemiological  and / or clinical management purposes  to differentiate between  SARS-CoV-2 and other Sarbecovirus currently known to infect humans.  If clinically indicated additional testing with an  alternate test  methodology [phone]([id number]) is advised. The SARS-CoV-2 RNA is generally  detectable in upper and lower respiratory sp ecimens during the acute  phase of infection. The expected result is Negative. Fact Sheet for Patients:  BoilerBrush.com.cyhttps://www.fda.gov/media/136312/download Fact Sheet for Healthcare Providers: https://pope.com/https://www.fda.gov/media/136313/download This test is not yet approved or cleared by the [place] FDA and has been authorized for detection and/or diagnosis of SARS-CoV-2 by FDA under an Emergency Use Authorization (EUA).  This EUA will remain in effect (meaning this test can be used) for the duration of the COVID-19 declaration under Section 564(b)(1) of the Act, 21 U.S.C. section 360bbb-3(b)(1), unless the authorization is terminated or revoked sooner. Performed at [facility], [address] [place], [place] [address]      Labs: BNP (last 3 results) No results for input(s): BNP in the last 8760 hours. Basic Metabolic Panel: Recent Labs  Lab 11/24/18 2033 11/24/18 2123 11/25/18 0250 11/26/18 0955  NA 135  --  135 137  K 4.2  --  3.8 4.0  CL 95*  --  100 107  CO2 21*  --  24 23  GLUCOSE 93  --  111* 126*  BUN 36*  --  32* 11  CREATININE 3.01*  --  1.93* 0.91  CALCIUM 10.9*  --  9.7 9.0  MG  --  2.4  --   --    Liver Function Tests: Recent Labs  Lab 11/24/18 2033 11/25/18 0250  AST 46* 40  ALT 23 19  ALKPHOS 102 75  BILITOT 1.7* 1.2  PROT 9.8* 7.6  ALBUMIN 5.7* 4.3   Recent Labs  Lab 11/24/18 2033  LIPASE 25   No results for input(s): AMMONIA in the last 168 hours. CBC: Recent Labs  Lab 11/24/18 2033 11/25/18 0250  WBC 13.0* 9.7  HGB 16.0 13.7  HCT 47.9 40.8  MCV 70.1* 69.7*  PLT 283 212   Cardiac Enzymes: Recent Labs  Lab 11/24/18 2123 11/26/18 0955  CKTOTAL 1,052* 635*   BNP: Invalid input(s): POCBNP CBG: No results for input(s): GLUCAP in the last 168 hours. D-Dimer No results for input(s): DDIMER in the last 72  hours. Hgb A1c No results for input(s): HGBA1C in the last 72 hours. Lipid Profile No results for input(s): CHOL, HDL, LDLCALC, TRIG, CHOLHDL, LDLDIRECT in the last 72 hours. Thyroid function studies No results for input(s): TSH, T4TOTAL, T3FREE, THYROIDAB in the last 72 hours.  Invalid input(s): FREET3 Anemia work up No results for input(s): VITAMINB12, FOLATE, FERRITIN, TIBC, IRON, RETICCTPCT in the last 72 hours. Urinalysis    Component Value Date/Time   COLORURINE AMBER (A) 11/24/2018 2053   APPEARANCEUR CLOUDY (A) 11/24/2018 2053   LABSPEC 1.026 11/24/2018 2053   PHURINE 5.0 11/24/2018 2053   GLUCOSEU NEGATIVE 11/24/2018 2053   HGBUR SMALL (A) 11/24/2018 2053   BILIRUBINUR NEGATIVE 11/24/2018 2053   KETONESUR 5 (A) 11/24/2018 2053   PROTEINUR 100 (A) 11/24/2018 2053   NITRITE NEGATIVE 11/24/2018 2053   LEUKOCYTESUR TRACE (A) 11/24/2018 2053   Sepsis Labs Invalid input(s): PROCALCITONIN,  WBC,  LACTICIDVEN Microbiology Recent Results (from the past 240 hour(s))  SARS Coronavirus 2 [facility](Hospital order, Performed in [facility] hospital lab)     Status: None   Collection Time: 11/24/18  9:49 PM  Result Value Ref Range Status   SARS Coronavirus 2 NEGATIVE NEGATIVE Final    Comment: (NOTE) If result is NEGATIVE SARS-CoV-2 target nucleic acids are NOT DETECTED. The SARS-CoV-2 RNA is generally detectable in upper and lower  respiratory specimens during the acute phase of infection. The lowest  concentration of SARS-CoV-2 viral copies this assay can detect is 250  copies / mL. A negative result does not preclude SARS-CoV-2 infection  and should not be used as the sole basis for treatment or other  patient management decisions.  A negative result may occur with  improper specimen collection / handling, submission of specimen other  than nasopharyngeal swab, presence of viral mutation(s) within the  areas targeted by this assay, and inadequate number of viral copies  (<250 copies  / mL). A negative result must be combined with clinical  observations, patient history, and epidemiological information. If result is POSITIVE SARS-CoV-2 target nucleic acids are DETECTED. The SARS-CoV-2 RNA is generally detectable in upper and lower  respiratory specimens dur ing the acute phase of infection.  Positive  results are indicative of active infection with SARS-CoV-2.  Clinical  correlation with patient history and other diagnostic information is  necessary to determine patient infection status.  Positive results do  not rule out bacterial infection or co-infection with other viruses. If result is PRESUMPTIVE POSTIVE SARS-CoV-2 nucleic acids MAY BE PRESENT.  A presumptive positive result was obtained on the submitted specimen  and confirmed on repeat testing.  While 2019 novel coronavirus  (SARS-CoV-2) nucleic acids may be present in the submitted sample  additional confirmatory testing may be necessary for epidemiological  and / or clinical management purposes  to differentiate between  SARS-CoV-2 and other Sarbecovirus currently known to infect humans.  If clinically indicated additional testing with an alternate test  methodology [phone]) is advised. The SARS-CoV-2 RNA is generally  detectable in upper and lower respiratory sp ecimens during the acute  phase of infection. The expected result is Negative. Fact Sheet for Patients:  StrictlyIdeas.no Fact Sheet for Healthcare Providers: BankingDealers.co.za This test is not yet approved or cleared by the [place] FDA and has been authorized for detection and/or diagnosis of SARS-CoV-2 by FDA under an Emergency Use Authorization (EUA).  This EUA will remain in effect (meaning this test can be used) for the duration of the COVID-19 declaration under Section 564(b)(1) of the Act, 21 U.S.C. section 360bbb-3(b)(1), unless the authorization is terminated or revoked  sooner. Performed at [facility], [place] [address], [place], [place] [address]      Time coordinating discharge: 35 minutes  SIGNED:   Aline August, MD  [facility] 11/26/2018, 11:32 AM

## 2019-02-10 IMAGING — DX DG SACRUM/COCCYX 2+V
3 series · 3 of 3 positions shown · non-contrast
Comparison: Lumbar spine series of today's date

CLINICAL DATA: 3-4 days of low back and sacral discomfort with
limitation of range of motion. No known injury

EXAM:
SACRUM AND COCCYX - 2+ VIEW

[coccyx ap]
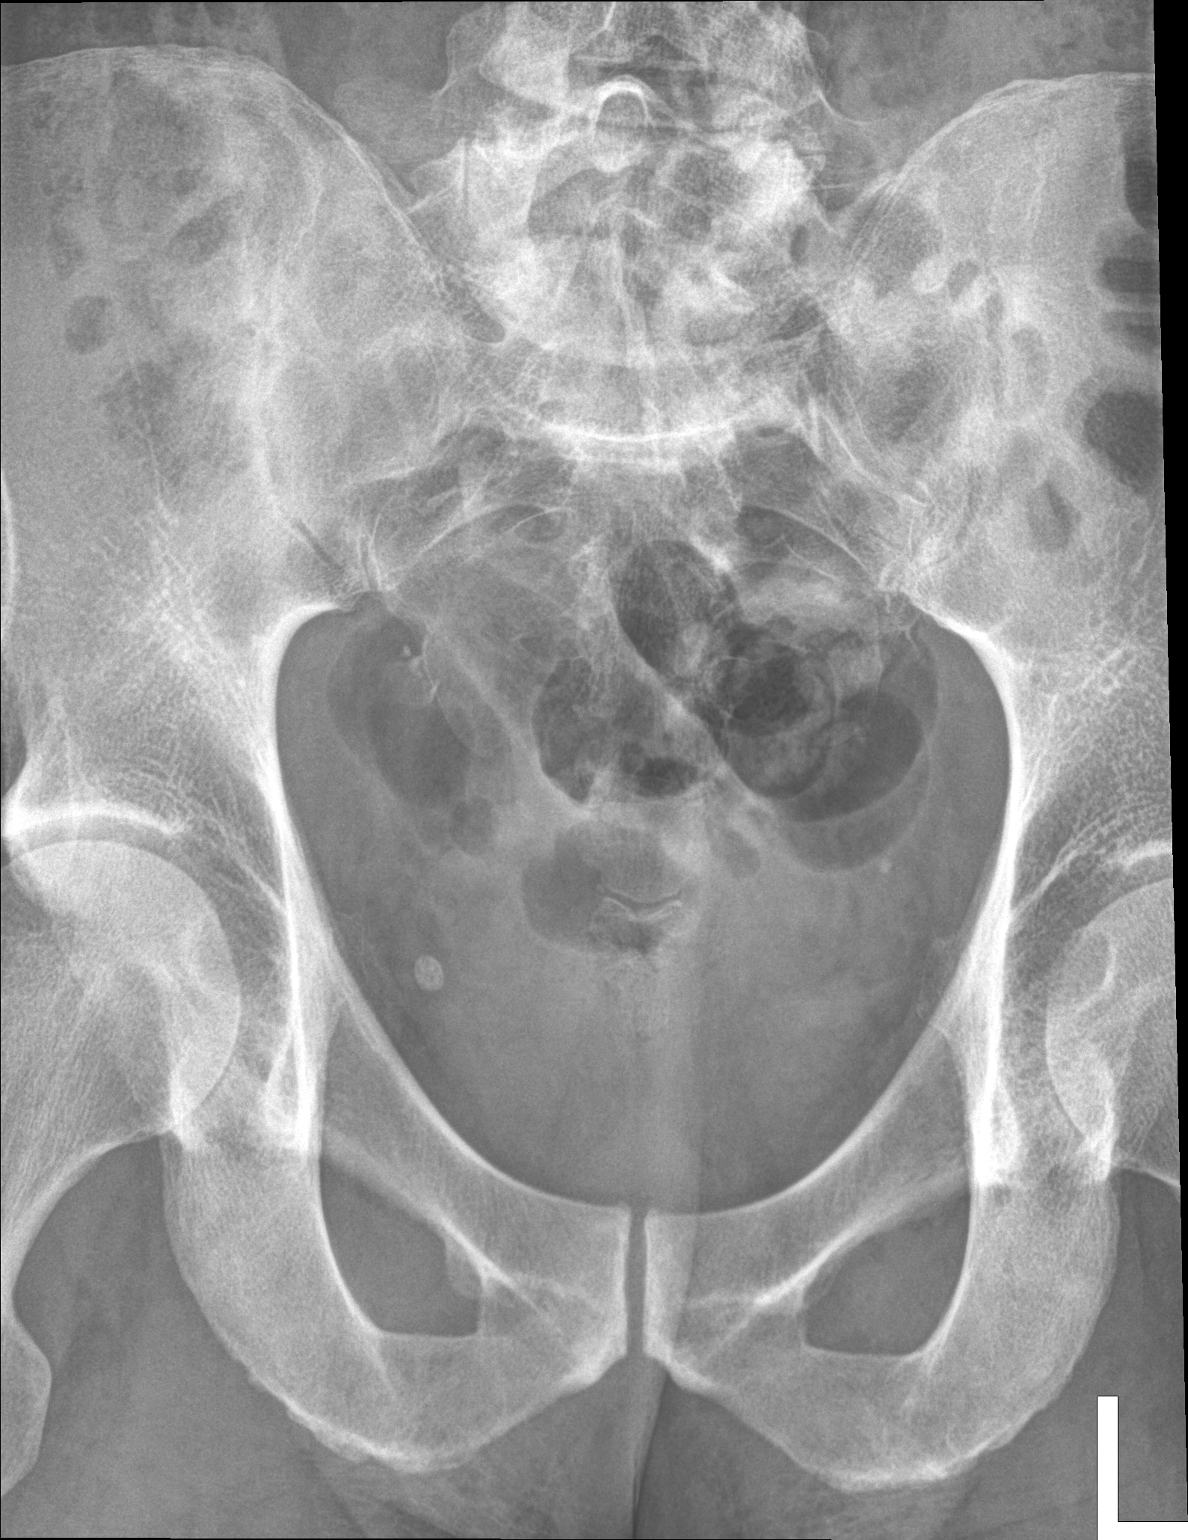

[sacrum ap]
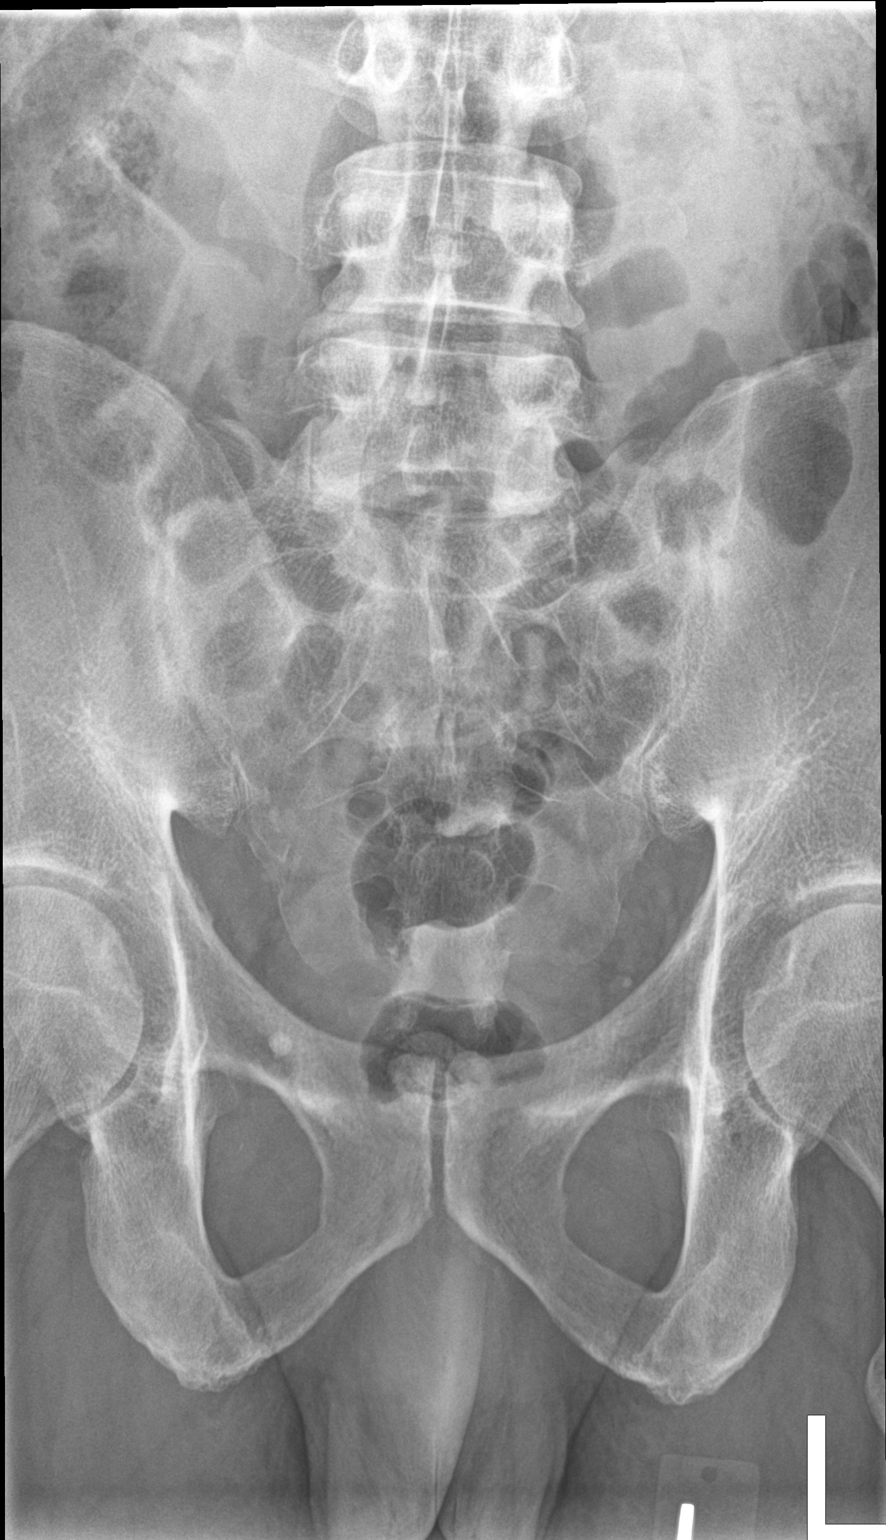

[sacrum lat]
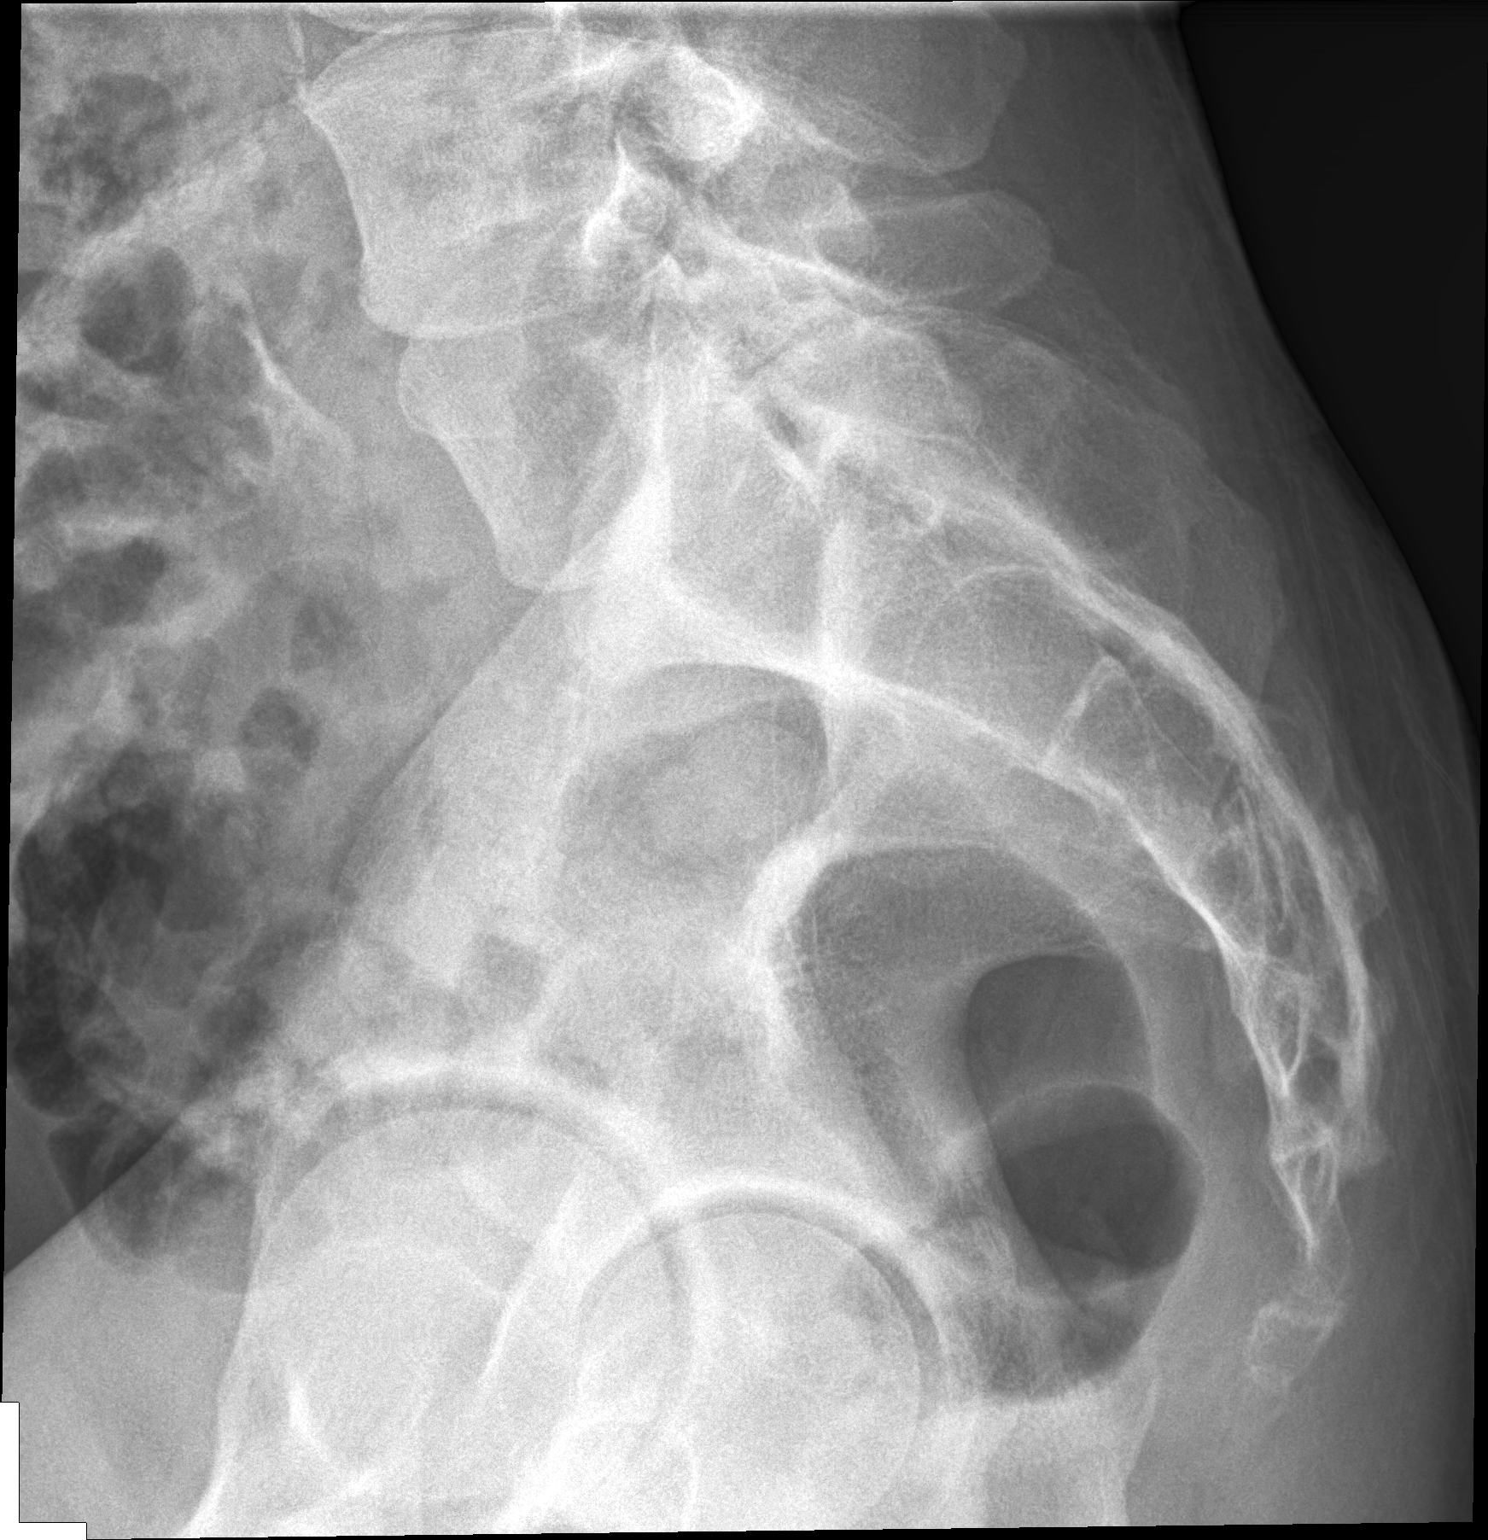

[3 of 3 positions shown; findings below may reference images not displayed]

FINDINGS: The sacrum is subjectively adequately mineralized as is the coccyx.
The presacral soft tissues are normal. No acute sacral fracture is
observed. The SI joints are grossly normal for age. There are least
3 intact sacral struts visualized.
IMPRESSION: There is no acute or significant chronic bony abnormality of the
sacrum or coccyx.

## 2019-02-10 IMAGING — DX DG LUMBAR SPINE COMPLETE 4+V
5 series · 5 of 5 positions shown · non-contrast
Comparison: None in PACs

CLINICAL DATA: Low back pain for the past 3-4 days. No report of
injury.

EXAM:
LUMBAR SPINE - COMPLETE 4+ VIEW

[l-spine ap]
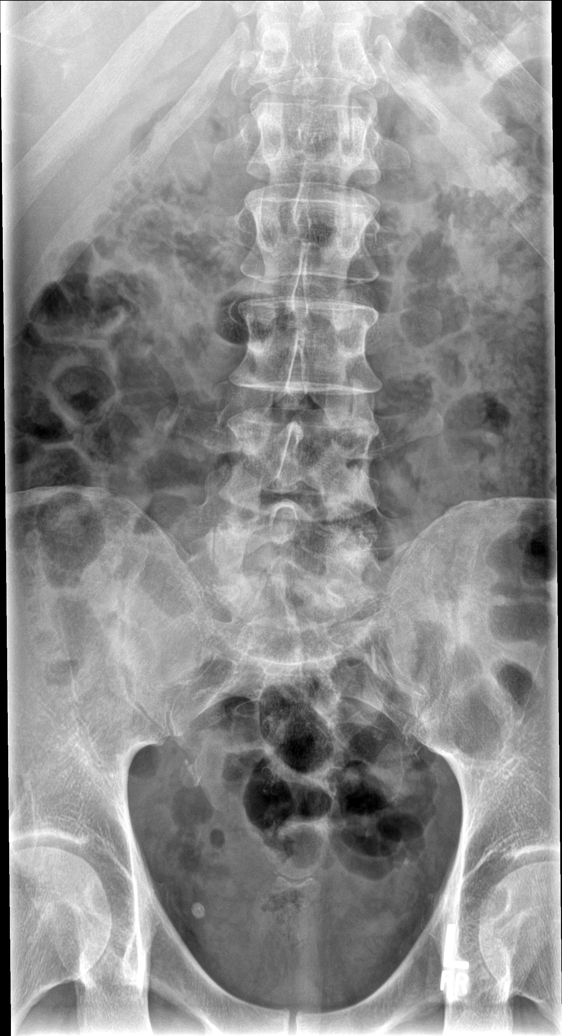

[l-spine obl (1 of 2)]
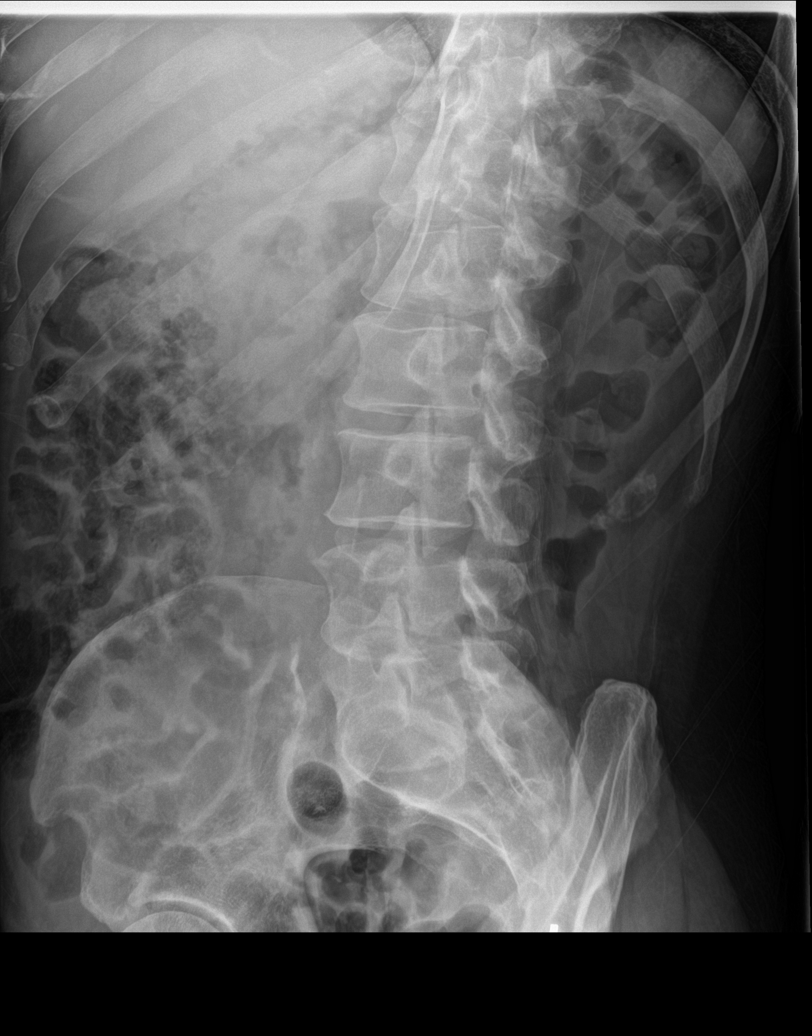

[l-spine obl (2 of 2)]
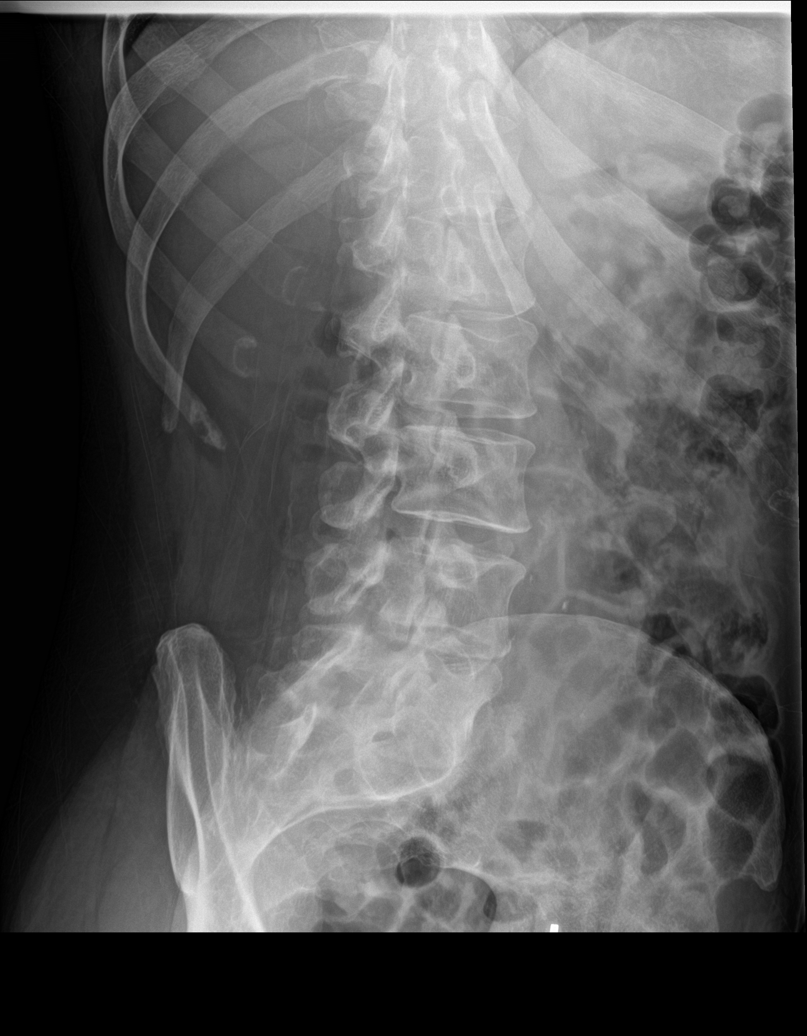

[l-spine lat]
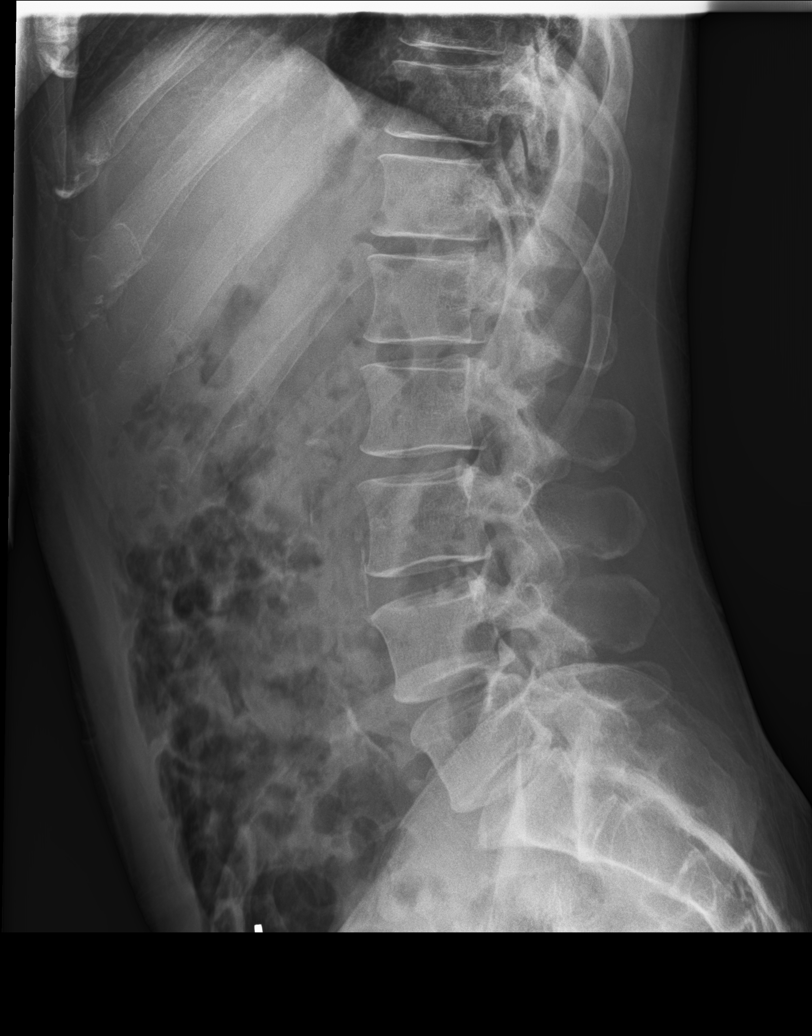

[l-spine l5-s1]
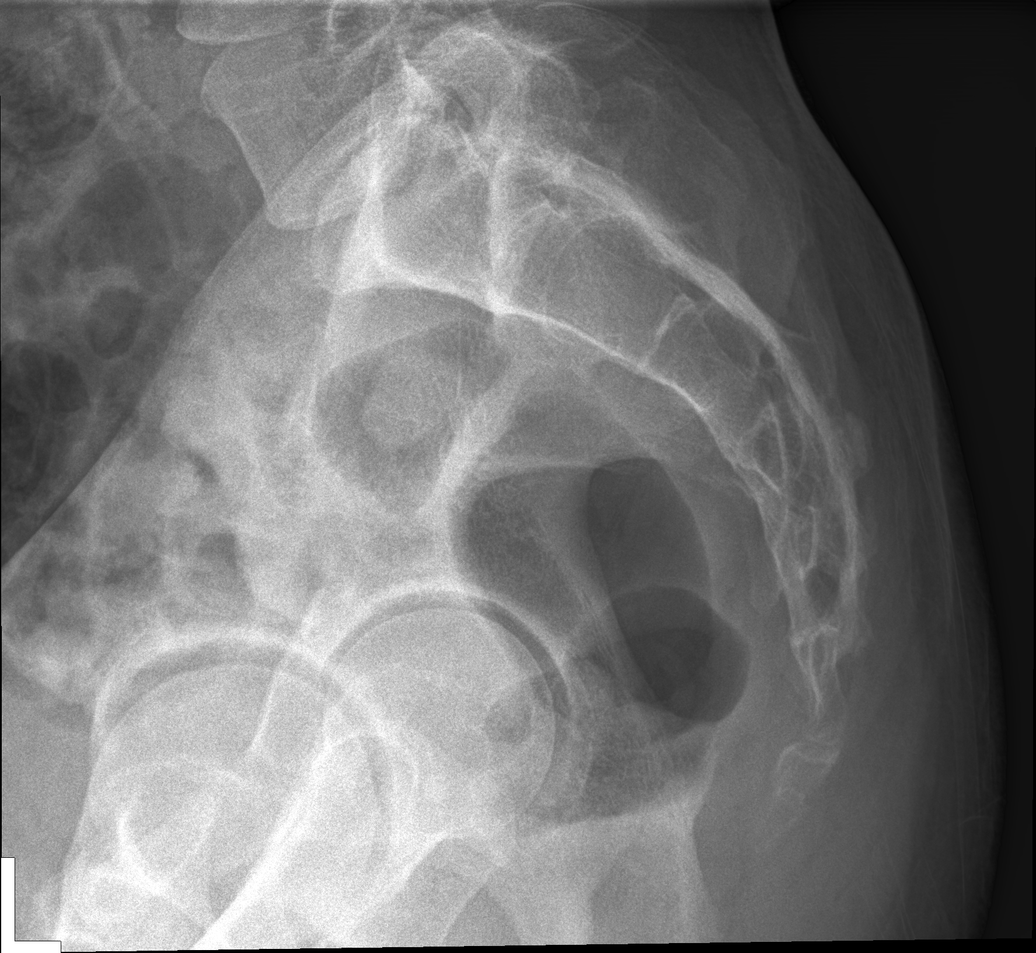

[5 of 5 positions shown; findings below may reference images not displayed]

FINDINGS: The lumbar vertebral bodies are preserved in height. The disc space
heights are well maintained. There is no spondylolisthesis. The
pedicles and transverse processes are intact. There is mild facet
joint hypertrophy at L5-S1. The observed portions of the sacrum are
normal. There is calcification in the wall of the abdominal aorta.
IMPRESSION: There is no acute bony abnormality of the lumbar spine. There is
mild degenerative facet joint change at L5-S1.

## 2021-12-20 ENCOUNTER — Ambulatory Visit (HOSPITAL_COMMUNITY)
Admission: EM | Admit: 2021-12-20 | Discharge: 2021-12-20 | Disposition: A | Discharge status: Home / Self Care | Payer: Self-pay | Attending: Family Medicine | Admitting: Family Medicine

## 2021-12-20 ENCOUNTER — Encounter (HOSPITAL_COMMUNITY): Payer: Self-pay

## 2021-12-20 ENCOUNTER — Ambulatory Visit (INDEPENDENT_AMBULATORY_CARE_PROVIDER_SITE_OTHER): Payer: Self-pay

## 2021-12-20 DIAGNOSIS — M25512 Pain in left shoulder: Secondary | ICD-10-CM

## 2021-12-20 DIAGNOSIS — M5412 Radiculopathy, cervical region: Secondary | ICD-10-CM

## 2021-12-20 DIAGNOSIS — M542 Cervicalgia: Secondary | ICD-10-CM

## 2021-12-20 MED ORDER — KETOROLAC TROMETHAMINE 30 MG/ML IJ SOLN
INTRAMUSCULAR | Status: AC
  Filled 2021-12-20: qty 1

## 2021-12-20 MED ORDER — KETOROLAC TROMETHAMINE 30 MG/ML IJ SOLN
30.0000 mg | Freq: Once | INTRAMUSCULAR | Status: AC
  Administered 2021-12-20: 30 mg via INTRAMUSCULAR

## 2021-12-20 MED ORDER — IBUPROFEN 800 MG PO TABS: 800.0000 mg | ORAL_TABLET | Freq: Three times a day (TID) | ORAL | 0 refills | Status: DC | PRN

## 2021-12-20 MED ORDER — TIZANIDINE HCL 4 MG PO TABS: 4.0000 mg | ORAL_TABLET | Freq: Three times a day (TID) | ORAL | 0 refills | Status: DC | PRN

## 2021-12-20 NOTE — ED Provider Notes (Signed)
[facility]    CSN: [id number] Arrival date & time: 12/20/21  1041      History   Chief Complaint Chief Complaint  Patient presents with   Shoulder Pain    HPI Jose Shannon is a 46 y.o. male.    Shoulder Pain  Here for left shoulder and left neck pain.  It began several weeks ago.  He feels it began when he was pulling on some heavy objects at work. No fall or trauma  He does feel like the pain starts in his left side of his neck and then radiates down.  He also has some numbness down into that left arm.   History reviewed. No pertinent past medical history.  Patient Active Problem List   Diagnosis Date Noted   ARF (acute renal failure) (HCC) 11/24/2018   Nausea & vomiting 11/24/2018   Benign essential HTN 11/24/2018   Leukocytosis 11/24/2018    History reviewed. No pertinent surgical history.     Home Medications    Prior to Admission medications   Medication Sig Start Date End Date Taking? Authorizing Provider  ibuprofen (ADVIL) 800 MG tablet Take 1 tablet (800 mg total) by mouth every 8 (eight) hours as needed (pain). 12/20/21  Yes Loryn Haacke, Janace Aris, MD  tiZANidine (ZANAFLEX) 4 MG tablet Take 1 tablet (4 mg total) by mouth every 8 (eight) hours as needed for muscle spasms. 12/20/21  Yes Zenia Resides, MD  acetaminophen (TYLENOL) 650 MG CR tablet Take 650 mg by mouth every 8 (eight) hours as needed for pain.    [provider]  amLODipine (NORVASC) 5 MG tablet Take 1 tablet (5 mg total) by mouth daily. 11/27/18   Glade Lloyd, MD  meclizine (DRAMAMINE II) 25 MG tablet Take 25-50 mg by mouth 3 (three) times daily as needed for nausea.    [provider]    Family History History reviewed. No pertinent family history.  Social History Social History   Tobacco Use   Smoking status: Every Day    Packs/day: 3.00    Years: 0.20    Total pack years: 0.60    Types: Cigars, Cigarettes   Smokeless tobacco: Never   Substance Use Topics   Alcohol use: Yes    Comment: weekends     Allergies   Patient has no known allergies.   Review of Systems Review of Systems   Physical Exam Triage Vital Signs ED Triage Vitals  Enc Vitals Group     BP 12/20/21 1200 119/81     Pulse Rate 12/20/21 1200 94     Resp 12/20/21 1200 12     Temp 12/20/21 1200 98.5 F (36.9 C)     Temp Source 12/20/21 1200 Oral     SpO2 12/20/21 1200 98 %     Weight 12/20/21 1158 180 lb (81.6 kg)     Height 12/20/21 1158 6\' 1"  (1.854 m)     Head Circumference --      Peak Flow --      Pain Score 12/20/21 1158 10     Pain Loc --      Pain Edu? --      Excl. in GC? --    No data found.  Updated Vital Signs BP 119/81 (BP Location: Left Arm)   Pulse 94   Temp 98.5 F (36.9 C) (Oral)   Resp 12   Ht 6\' 1"  (1.854 m)   Wt 81.6 kg   SpO2 98%  BMI 23.75 kg/m   Visual Acuity Right Eye Distance:   Left Eye Distance:   Bilateral Distance:    Right Eye Near:   Left Eye Near:    Bilateral Near:     Physical Exam Vitals reviewed.  Constitutional:      General: He is not in acute distress.    Appearance: He is not ill-appearing, toxic-appearing or diaphoretic.  HENT:     Mouth/Throat:     Mouth: Mucous membranes are moist.  Eyes:     Extraocular Movements: Extraocular movements intact.     Pupils: Pupils are equal, round, and reactive to light.  Musculoskeletal:     Cervical back: Tenderness (left trap) present.     Comments: Forward flexion of the left arm does cause increased pain in that left shoulder and posterior left shoulder      UC Treatments / Results  Labs (all labs ordered are listed, but only abnormal results are displayed) Labs Reviewed - No data to display  EKG   Radiology DG Cervical Spine 2-3 Views  Result Date: 12/20/2021 CLINICAL DATA:  Neck pain, left shoulder pain EXAM: CERVICAL SPINE - 2-3 VIEW COMPARISON:  None Available. FINDINGS: No recent fracture is seen. Alignment of  posterior margins of vertebral bodies is unremarkable. Degenerative changes are noted with bony spurs from C3-C7 levels. Evaluation of neural foramina is limited without oblique views. In the AP view, there is encroachment of neural foramina by bony spurs from C3-C7 levels. IMPRESSION: No recent fracture is seen. Cervical spondylosis with possible encroachment of neural foramina from C3-C7 levels. Electronically Signed   By: Ernie Avena M.D.   On: 12/20/2021 12:42   DG Shoulder Left  Result Date: 12/20/2021 CLINICAL DATA:  Left shoulder pain EXAM: LEFT SHOULDER - 2+ VIEW COMPARISON:  None Available. FINDINGS: No fracture or dislocation is seen. No abnormal soft tissue calcifications are seen. Small bony spurs are seen in left AC joint. IMPRESSION: No fracture or dislocation is seen in left shoulder. No abnormal soft tissue calcifications are seen. Degenerative changes are noted with bony spurs in left AC joint. Electronically Signed   By: Ernie Avena M.D.   On: 12/20/2021 12:40    Procedures Procedures (including critical care time)  Medications Ordered in UC Medications  ketorolac (TORADOL) 30 MG/ML injection 30 mg (has no administration in time range)    Initial Impression / Assessment and Plan / UC Course  I have reviewed the triage vital signs and the nursing notes.  Pertinent labs & imaging results that were available during my care of the patient were reviewed by me and considered in my medical decision making (see chart for details).     X-ray shows some degenerative changes in both the shoulder x-rays and in the C-spine x-rays.  Pain relief is provided and muscle relaxers are sent also.  Assistance is requested for helping him find a PCP Final Clinical Impressions(s) / UC Diagnoses   Final diagnoses:  Cervical radiculopathy     Discharge Instructions      Your x-rays did show some arthritis type changes in the neck especially and also some in the  shoulder  Take ibuprofen 800 mg--1 tab every 8 hours as needed for pain.    Take tizanidine 4 mg--1 every 8 hours as needed for muscle spasms  You have been given a shot of Toradol 30 mg today.  You can use the QR code/website at the back of the summary paperwork to schedule yourself a  new patient appointment with primary care      ED Prescriptions     Medication Sig Dispense Auth. Provider   ibuprofen (ADVIL) 800 MG tablet Take 1 tablet (800 mg total) by mouth every 8 (eight) hours as needed (pain). 21 tablet Mccall Lomax, Janace Aris, MD   tiZANidine (ZANAFLEX) 4 MG tablet Take 1 tablet (4 mg total) by mouth every 8 (eight) hours as needed for muscle spasms. 30 tablet Alana Dayton, Janace Aris, MD      I have reviewed the PDMP during this encounter.   Zenia Resides, MD 12/20/21 1256

## 2021-12-20 NOTE — Discharge Instructions (Addendum)
Your x-rays did show some arthritis type changes in the neck especially and also some in the shoulder  Take ibuprofen 800 mg--1 tab every 8 hours as needed for pain.    Take tizanidine 4 mg--1 every 8 hours as needed for muscle spasms  You have been given a shot of Toradol 30 mg today.  You can use the QR code/website at the back of the summary paperwork to schedule yourself a new patient appointment with primary care

## 2023-02-15 ENCOUNTER — Encounter (HOSPITAL_COMMUNITY): Payer: Self-pay

## 2023-02-15 ENCOUNTER — Ambulatory Visit (HOSPITAL_COMMUNITY)
Admission: RE | Admit: 2023-02-15 | Discharge: 2023-02-15 | Disposition: A | Discharge status: Home / Self Care | Payer: 59 | Source: Ambulatory Visit | Attending: Family Medicine | Admitting: Family Medicine

## 2023-02-15 VITALS — BP 157/100 | HR 75 | Temp 98.6°F | Resp 16

## 2023-02-15 DIAGNOSIS — L0291 Cutaneous abscess, unspecified: Secondary | ICD-10-CM | POA: Diagnosis not present

## 2023-02-15 MED ORDER — LIDOCAINE-EPINEPHRINE 1 %-1:100000 IJ SOLN
INTRAMUSCULAR | Status: AC
  Filled 2023-02-15: qty 1

## 2023-02-15 MED ORDER — SULFAMETHOXAZOLE-TRIMETHOPRIM 800-160 MG PO TABS: 1.0000 | ORAL_TABLET | Freq: Two times a day (BID) | ORAL | 0 refills | Status: AC

## 2023-02-15 NOTE — ED Provider Notes (Signed)
[facility]    CSN: [id number] Arrival date & time: 02/15/23  1660      History   Chief Complaint Chief Complaint  Patient presents with   Abscess    HPI Jose Shannon is a 47 y.o. male.    Abscess  He has a bump on the back of his neck.  Has had it for a while.  They tried to pop it and it got inflamed and irritated.  No other issues today.        History reviewed. No pertinent past medical history.  Patient Active Problem List   Diagnosis Date Noted   ARF (acute renal failure) (HCC) 11/24/2018   Nausea & vomiting 11/24/2018   Benign essential HTN 11/24/2018   Leukocytosis 11/24/2018    History reviewed. No pertinent surgical history.     Home Medications    Prior to Admission medications   Medication Sig Start Date End Date Taking? Authorizing Provider  acetaminophen (TYLENOL) 650 MG CR tablet Take 650 mg by mouth every 8 (eight) hours as needed for pain.    [provider]  amLODipine (NORVASC) 5 MG tablet Take 1 tablet (5 mg total) by mouth daily. 11/27/18   Glade Lloyd, MD  ibuprofen (ADVIL) 800 MG tablet Take 1 tablet (800 mg total) by mouth every 8 (eight) hours as needed (pain). 12/20/21   Zenia Resides, MD  meclizine (DRAMAMINE II) 25 MG tablet Take 25-50 mg by mouth 3 (three) times daily as needed for nausea.    [provider]  tiZANidine (ZANAFLEX) 4 MG tablet Take 1 tablet (4 mg total) by mouth every 8 (eight) hours as needed for muscle spasms. 12/20/21   Zenia Resides, MD    Family History History reviewed. No pertinent family history.  Social History Social History   Tobacco Use   Smoking status: Every Day    Current packs/day: 3.00    Average packs/day: 3.0 packs/day for 0.2 years (0.6 ttl pk-yrs)    Types: Cigars, Cigarettes   Smokeless tobacco: Never  Substance Use Topics   Alcohol use: Yes    Comment: weekends     Allergies   Patient has no known allergies.   Review of  Systems Review of Systems  Constitutional: Negative.   HENT: Negative.    Respiratory: Negative.    Cardiovascular: Negative.   Gastrointestinal: Negative.   Musculoskeletal: Negative.   Skin:  Positive for wound.  Psychiatric/Behavioral: Negative.       Physical Exam Triage Vital Signs ED Triage Vitals  Encounter Vitals Group     BP 02/15/23 0956 (!) 157/100     Systolic BP Percentile --      Diastolic BP Percentile --      Pulse Rate 02/15/23 0955 75     Resp 02/15/23 0955 16     Temp 02/15/23 0955 98.6 F (37 C)     Temp Source 02/15/23 0955 Oral     SpO2 02/15/23 0955 97 %     Weight --      Height --      Head Circumference --      Peak Flow --      Pain Score 02/15/23 0956 7     Pain Loc --      Pain Education --      Exclude from Growth Chart --    No data found.  Updated Vital Signs BP (!) 157/100 (BP Location: Left Arm)   Pulse 75  Temp 98.6 F (37 C) (Oral)   Resp 16   SpO2 97%   Visual Acuity Right Eye Distance:   Left Eye Distance:   Bilateral Distance:    Right Eye Near:   Left Eye Near:    Bilateral Near:     Physical Exam Constitutional:      Appearance: Normal appearance.  Skin:    Comments: At the back of the neck, just below the hairline is a raised, tender, erythematous lump;  no drainage at this time.   Neurological:     General: No focal deficit present.     Mental Status: He is alert.  Psychiatric:        Mood and Affect: Mood normal.      UC Treatments / Results  Labs (all labs ordered are listed, but only abnormal results are displayed) Labs Reviewed - No data to display  EKG   Radiology No results found.  Procedures Incision and Drainage  Date/Time: 02/15/2023 10:25 AM  Performed by: Jannifer Franklin, MD Authorized by: Jannifer Franklin, MD   Consent:    Consent obtained:  Verbal   Consent given by:  Patient Location:    Indications for incision and drainage: infected cyst.   Location:  Neck   Neck  location:  R posterior Pre-procedure details:    Skin preparation:  Chlorhexidine with alcohol Sedation:    Sedation type:  None Procedure type:    Complexity:  Simple Procedure details:    Incision depth:  Dermal   Drainage:  Bloody (thick white drainage)   Drainage amount:  Moderate   Wound treatment:  Wound left open   Packing materials:  None Post-procedure details:    Procedure completion:  Tolerated  (including critical care time)  Medications Ordered in UC Medications - No data to display  Initial Impression / Assessment and Plan / UC Course  I have reviewed the triage vital signs and the nursing notes.  Pertinent labs & imaging results that were available during my care of the patient were reviewed by me and considered in my medical decision making (see chart for details).   Final Clinical Impressions(s) / UC Diagnoses   Final diagnoses:  Abscess     Discharge Instructions      You were seen today for an infected cyst.  This was opened and drained today.  Please keep covered while it continues to drain.  Once closed you may shower/wash hair as usual.  I have sent out an antibiotic to take twice/day x 7 days.  Please return if not improving.     ED Prescriptions     Medication Sig Dispense Auth. Provider   sulfamethoxazole-trimethoprim (BACTRIM DS) 800-160 MG tablet Take 1 tablet by mouth 2 (two) times daily for 7 days. 14 tablet Jannifer Franklin, MD      PDMP not reviewed this encounter.   Jannifer Franklin, MD 02/15/23 1026

## 2023-02-15 NOTE — ED Triage Notes (Signed)
Pt states abscess to the back of his neck.  States he has had it for years but his wife and daughter tried to pop it and it became bigger and painful.

## 2023-02-15 NOTE — Discharge Instructions (Signed)
You were seen today for an infected cyst.  This was opened and drained today.  Please keep covered while it continues to drain.  Once closed you may shower/wash hair as usual.  I have sent out an antibiotic to take twice/day x 7 days.  Please return if not improving.

## 2023-06-30 ENCOUNTER — Telehealth (HOSPITAL_COMMUNITY): Payer: Self-pay | Admitting: Internal Medicine

## 2023-06-30 ENCOUNTER — Encounter (HOSPITAL_COMMUNITY): Payer: Self-pay

## 2023-06-30 ENCOUNTER — Ambulatory Visit (HOSPITAL_COMMUNITY)
Admission: RE | Admit: 2023-06-30 | Discharge: 2023-06-30 | Disposition: A | Discharge status: Home / Self Care | Payer: Self-pay | Source: Ambulatory Visit | Attending: Internal Medicine | Admitting: Internal Medicine

## 2023-06-30 VITALS — BP 150/104 | HR 97 | Temp 98.8°F | Resp 16 | Ht 73.0 in | Wt 180.0 lb

## 2023-06-30 DIAGNOSIS — G4489 Other headache syndrome: Secondary | ICD-10-CM | POA: Insufficient documentation

## 2023-06-30 DIAGNOSIS — I10 Essential (primary) hypertension: Secondary | ICD-10-CM | POA: Insufficient documentation

## 2023-06-30 LAB — CBC
HCT: 38.6 % — ABNORMAL LOW (ref 39.0–52.0)
Hemoglobin: 12.5 g/dL — ABNORMAL LOW (ref 13.0–17.0)
MCH: 22.3 pg — ABNORMAL LOW (ref 26.0–34.0)
MCHC: 32.4 g/dL (ref 30.0–36.0)
MCV: 68.8 fL — ABNORMAL LOW (ref 80.0–100.0)
Platelets: 180 10*3/uL (ref 150–400)
RBC: 5.61 MIL/uL (ref 4.22–5.81)
RDW: 17.2 % — ABNORMAL HIGH (ref 11.5–15.5)
WBC: 6.8 10*3/uL (ref 4.0–10.5)
nRBC: 0 % (ref 0.0–0.2)

## 2023-06-30 LAB — BASIC METABOLIC PANEL
Anion gap: 14 (ref 5–15)
BUN: 15 mg/dL (ref 6–20)
CO2: 22 mmol/L (ref 22–32)
Calcium: 9.3 mg/dL (ref 8.9–10.3)
Chloride: 107 mmol/L (ref 98–111)
Creatinine, Ser: 1.05 mg/dL (ref 0.61–1.24)
GFR, Estimated: >60 mL/min (ref >60)
Glucose, Bld: 90 mg/dL (ref 70–99)
Potassium: 4.2 mmol/L (ref 3.5–5.1)
Sodium: 143 mmol/L (ref 135–145)

## 2023-06-30 LAB — HEMOGLOBIN A1C
Hgb A1c MFr Bld: 5.8 % — ABNORMAL HIGH (ref 4.8–5.6)
Mean Plasma Glucose: 119.76 mg/dL

## 2023-06-30 MED ORDER — AMLODIPINE BESYLATE 5 MG PO TABS: 5.0000 mg | ORAL_TABLET | Freq: Every day | ORAL | 3 refills | Status: AC

## 2023-06-30 NOTE — ED Provider Notes (Signed)
 [facility]    CSN: [id number] Arrival date & time: 06/30/23  1347      History   Chief Complaint Chief Complaint  Patient presents with   Headache    Blood pressure was high 199/154 second 174/125 - Entered by patient    HPI Jose Shannon is a 48 y.o. male.   48 year old male who presents urgent care with complaints of headache and high blood pressure.  He reports he has had an intermittent headache for about 2 weeks.  It seems to be worse in the morning and gets better throughout the day.  He goes away completely at times.  He does take Advil which helps.  He also noted that he had some high blood pressure this morning.  His wife reports she has been trying to get him to go to the doctor for this.  They took his blood pressure this morning as he developed some pain behind his eyes which has resolved now.  He denies blurred vision, double vision, weakness, numbness, tingling, slurred speech.  He denies a history of high blood pressure in the past.  He does have a history of acute renal failure. Reviewing the chart his creatinine 10/2018 was 3.01 and 2 days later was 1.9. He has never followed up on this.    Headache Associated symptoms: eye pain (Just today with blood pressure elevated)   Associated symptoms: no abdominal pain, no back pain, no cough, no dizziness, no ear pain, no fever, no numbness, no seizures, no sore throat, no vomiting and no weakness     History reviewed. No pertinent past medical history.  Patient Active Problem List   Diagnosis Date Noted   ARF (acute renal failure) (HCC) 11/24/2018   Nausea & vomiting 11/24/2018   Benign essential HTN 11/24/2018   Leukocytosis 11/24/2018    History reviewed. No pertinent surgical history.     Home Medications    Prior to Admission medications   Medication Sig Start Date End Date Taking? Authorizing Provider  amLODipine (NORVASC) 5 MG tablet Take 1 tablet (5 mg total) by mouth daily. 06/30/23    Landis Martins, PA-C    Family History History reviewed. No pertinent family history.  Social History Social History   Tobacco Use   Smoking status: Former    Current packs/day: 3.00    Average packs/day: 3.0 packs/day for 0.2 years (0.6 ttl pk-yrs)    Types: Cigars, Cigarettes   Smokeless tobacco: Never  Vaping Use   Vaping status: Every Day  Substance Use Topics   Alcohol use: Yes    Comment: weekends   Drug use: Yes    Types: Marijuana     Allergies   Patient has no known allergies.   Review of Systems Review of Systems  Constitutional:  Negative for activity change, chills and fever.  HENT:  Negative for ear pain and sore throat.   Eyes:  Positive for pain (Just today with blood pressure elevated). Negative for visual disturbance.  Respiratory:  Negative for cough and shortness of breath.   Cardiovascular:  Negative for chest pain and palpitations.  Gastrointestinal:  Negative for abdominal pain and vomiting.  Genitourinary:  Negative for decreased urine volume, difficulty urinating, dysuria, enuresis and hematuria.  Musculoskeletal:  Negative for arthralgias and back pain.  Skin:  Negative for color change and rash.  Neurological:  Positive for headaches. Negative for dizziness, seizures, syncope, weakness and numbness.  All other systems reviewed and are negative.  Physical Exam Triage Vital Signs ED Triage Vitals  Encounter Vitals Group     BP 06/30/23 1412 (!) 150/104     Systolic BP Percentile --      Diastolic BP Percentile --      Pulse Rate 06/30/23 1412 97     Resp 06/30/23 1412 16     Temp 06/30/23 1412 98.8 F (37.1 C)     Temp Source 06/30/23 1412 Oral     SpO2 06/30/23 1412 94 %     Weight 06/30/23 1411 180 lb (81.6 kg)     Height 06/30/23 1411 6\' 1"  (1.854 m)     Head Circumference --      Peak Flow --      Pain Score 06/30/23 1410 1     Pain Loc --      Pain Education --      Exclude from Growth Chart --    No data  found.  Updated Vital Signs BP (!) 150/104 (BP Location: Left Arm)   Pulse 97   Temp 98.8 F (37.1 C) (Oral)   Resp 16   Ht 6\' 1"  (1.854 m)   Wt 180 lb (81.6 kg)   SpO2 94%   BMI 23.75 kg/m   Visual Acuity Right Eye Distance:   Left Eye Distance:   Bilateral Distance:    Right Eye Near:   Left Eye Near:    Bilateral Near:     Physical Exam Vitals and nursing note reviewed.  Constitutional:      General: He is not in acute distress.    Appearance: He is well-developed.  HENT:     Head: Normocephalic and atraumatic.  Eyes:     Extraocular Movements: Extraocular movements intact.     Right eye: No nystagmus.     Left eye: No nystagmus.     Conjunctiva/sclera: Conjunctivae normal.     Pupils: Pupils are equal, round, and reactive to light.  Cardiovascular:     Rate and Rhythm: Normal rate and regular rhythm.     Heart sounds: Normal heart sounds. No murmur heard. Pulmonary:     Effort: Pulmonary effort is normal. No respiratory distress.     Breath sounds: Normal breath sounds.  Abdominal:     Palpations: Abdomen is soft.     Tenderness: There is no abdominal tenderness.  Musculoskeletal:        General: No swelling.     Cervical back: Neck supple.  Skin:    General: Skin is warm and dry.     Capillary Refill: Capillary refill takes less than 2 seconds.  Neurological:     Mental Status: He is alert and oriented to person, place, and time.     Cranial Nerves: No cranial nerve deficit.     Sensory: No sensory deficit.     Motor: No weakness.     Coordination: Coordination normal.     Gait: Gait normal.  Psychiatric:        Mood and Affect: Mood normal.        Speech: Speech normal.        Behavior: Behavior normal.      UC Treatments / Results  Labs (all labs ordered are listed, but only abnormal results are displayed) Labs Reviewed  CBC  BASIC METABOLIC PANEL  HEMOGLOBIN A1C    EKG   Radiology No results found.  Procedures Procedures  (including critical care time)  Medications Ordered in UC Medications - No data to display  Initial Impression / Assessment and Plan / UC Course  I have reviewed the triage vital signs and the nursing notes.  Pertinent labs & imaging results that were available during my care of the patient were reviewed by me and considered in my medical decision making (see chart for details).     Essential hypertension  Other headache syndrome   Intermittent headache is most likely due to hypertension. This appears to be high on several previous urgent care visits. Reassuringly, your physical exam shows no acute abnormalities. Given the history of renal issues and no recent lab work we will get blood work today. Results will be available in about 24 hours and we will contact you with abnormal labs. Results will be on your MyChart. The following labs and recommendations are done today:  CBC to check blood counts BMP to check kidneys and electrolytes Hgb A1C to check for diabetes Referral placed for a primary care provider. Keep a log of your blood pressure to show to your primary care provider when you go for your appointment. We will start Amlodipine 5 mg daily Return to urgent care or PCP if symptoms worsen or fail to resolve.  Especially if you feel the headaches worsen or you develop chest pain, stroke like symptoms, or worsening of your blood pressure.   Final Clinical Impressions(s) / UC Diagnoses   Final diagnoses:  Essential hypertension  Other headache syndrome     Discharge Instructions      Intermittent headache is most likely due to hypertension. Reassuringly, your physical exam shows no acute abnormalities. Given the history of renal issues and no recent lab work we will get blood work today. Results will be available in about 24 hours and we will contact you with abnormal labs. Results will be on your MyChart. The following labs and recommendations are done today:  CBC to check  blood counts BMP to check kidneys and electrolytes Hgb A1C to check for diabetes Referral placed for a primary care provider. Keep a log of your blood pressure to show to your primary care provider when you go for your appointment. We will start Amlodipine 5 mg daily Return to urgent care or PCP if symptoms worsen or fail to resolve.  Especially if you feel the headaches worsen or you develop chest pain, stroke like symptoms, or worsening of your blood pressure.    ED Prescriptions     Medication Sig Dispense Auth. Provider   amLODipine (NORVASC) 5 MG tablet Take 1 tablet (5 mg total) by mouth daily. 30 tablet Landis Martins, [place]      PDMP not reviewed this encounter.   Landis Martins, [place] 06/30/23 1528

## 2023-06-30 NOTE — ED Triage Notes (Signed)
 Patient here today with c/o a headache X 2 weeks. Patient states that the headaches are worse in the morning but he takes Advil and it goes away. Patient took his blood pressure this morning and it was high. Patient is not on any BP meds.

## 2023-06-30 NOTE — Telephone Encounter (Signed)
 Called patient and verified name and date of birth.  Notify patient of BMP results.  No change in treatment.  Other lab work is still pending.

## 2023-06-30 NOTE — Discharge Instructions (Addendum)
 Intermittent headache is most likely due to hypertension. Reassuringly, your physical exam shows no acute abnormalities. Given the history of renal issues and no recent lab work we will get blood work today. Results will be available in about 24 hours and we will contact you with abnormal labs. Results will be on your MyChart. The following labs and recommendations are done today:  CBC to check blood counts BMP to check kidneys and electrolytes Hgb A1C to check for diabetes Referral placed for a primary care provider. Keep a log of your blood pressure to show to your primary care provider when you go for your appointment. We will start Amlodipine 5 mg daily Return to urgent care or PCP if symptoms worsen or fail to resolve.  Especially if you feel the headaches worsen or you develop chest pain, stroke like symptoms, or worsening of your blood pressure.

## 2023-08-12 ENCOUNTER — Ambulatory Visit (HOSPITAL_COMMUNITY): Payer: Self-pay

## 2023-08-15 ENCOUNTER — Ambulatory Visit: Payer: Self-pay

## 2023-08-15 ENCOUNTER — Encounter: Payer: Self-pay | Admitting: *Deleted

## 2023-08-15 ENCOUNTER — Ambulatory Visit
Admission: EM | Admit: 2023-08-15 | Discharge: 2023-08-15 | Disposition: A | Discharge status: Home / Self Care | Payer: Self-pay | Attending: Family Medicine | Admitting: Family Medicine

## 2023-08-15 ENCOUNTER — Other Ambulatory Visit: Payer: Self-pay

## 2023-08-15 DIAGNOSIS — M5441 Lumbago with sciatica, right side: Secondary | ICD-10-CM

## 2023-08-15 DIAGNOSIS — M5442 Lumbago with sciatica, left side: Secondary | ICD-10-CM

## 2023-08-15 HISTORY — DX: Essential (primary) hypertension: I10

## 2023-08-15 MED ORDER — KETOROLAC TROMETHAMINE 30 MG/ML IJ SOLN
30.0000 mg | Freq: Once | INTRAMUSCULAR | Status: AC
  Administered 2023-08-15: 30 mg via INTRAMUSCULAR

## 2023-08-15 MED ORDER — PREDNISONE 20 MG PO TABS: 40.0000 mg | ORAL_TABLET | Freq: Every day | ORAL | 0 refills | Status: AC

## 2023-08-15 MED ORDER — TIZANIDINE HCL 4 MG PO TABS: 4.0000 mg | ORAL_TABLET | Freq: Four times a day (QID) | ORAL | 0 refills | Status: AC | PRN

## 2023-08-15 NOTE — ED Provider Notes (Signed)
 [facility]    CSN: [id number] Arrival date & time: 08/15/23  1212      History   Chief Complaint Chief Complaint  Patient presents with   Back Pain    HPI Jose Shannon is a 48 y.o. male.    Back Pain  Patient is here for low back pain x 2 weeks.  No heavy lifting, no injury.  He has pain down both legs, which comes and goes.  No numbness/tingling.  Just a sharp/spasm pain.  He is taking otc remedies without much help.  He has has this back pain before, chronic, but has been a long time.        Past Medical History:  Diagnosis Date   Hypertension     Patient Active Problem List   Diagnosis Date Noted   ARF (acute renal failure) (HCC) 11/24/2018   Nausea & vomiting 11/24/2018   Benign essential HTN 11/24/2018   Leukocytosis 11/24/2018    History reviewed. No pertinent surgical history.     Home Medications    Prior to Admission medications   Medication Sig Start Date End Date Taking? Authorizing Provider  amLODipine  (NORVASC ) 5 MG tablet Take 1 tablet (5 mg total) by mouth daily. 06/30/23  Yes Kreg Pesa, PA-C    Family History History reviewed. No pertinent family history.  Social History Social History   Tobacco Use   Smoking status: Former    Current packs/day: 3.00    Average packs/day: 3.0 packs/day for 0.2 years (0.6 ttl pk-yrs)    Types: Cigars, Cigarettes   Smokeless tobacco: Never  Vaping Use   Vaping status: Every Day  Substance Use Topics   Alcohol use: Yes    Comment: weekends   Drug use: Yes    Types: Marijuana     Allergies   Patient has no known allergies.   Review of Systems Review of Systems  Constitutional: Negative.   HENT: Negative.    Respiratory: Negative.    Cardiovascular: Negative.   Gastrointestinal: Negative.   Musculoskeletal:  Positive for back pain.  Psychiatric/Behavioral: Negative.       Physical Exam Triage Vital Signs ED Triage Vitals  Encounter Vitals  Group     BP 08/15/23 1314 (!) 141/92     Systolic BP Percentile --      Diastolic BP Percentile --      Pulse Rate 08/15/23 1314 91     Resp 08/15/23 1314 16     Temp 08/15/23 1314 98.4 F (36.9 C)     Temp Source 08/15/23 1314 Oral     SpO2 08/15/23 1314 95 %     Weight --      Height --      Head Circumference --      Peak Flow --      Pain Score 08/15/23 1313 7     Pain Loc --      Pain Education --      Exclude from Growth Chart --    No data found.  Updated Vital Signs BP (!) 141/92 (BP Location: Left Arm)   Pulse 91   Temp 98.4 F (36.9 C) (Oral)   Resp 16   SpO2 95%   Visual Acuity Right Eye Distance:   Left Eye Distance:   Bilateral Distance:    Right Eye Near:   Left Eye Near:    Bilateral Near:     Physical Exam Constitutional:      General: He is  not in acute distress.    Appearance: Normal appearance. He is normal weight.  Musculoskeletal:     Comments: +TTP to the lumbar spine, and low paraspinals;  full rom of the LE bilaterally;  pain with flexion of the hips and rotation bilaterally;  straight leg raise negative bilaterally  Skin:    General: Skin is warm.  Neurological:     General: No focal deficit present.     Mental Status: He is alert and oriented to person, place, and time.     Motor: No weakness.     Coordination: Coordination normal.     Gait: Gait normal.      UC Treatments / Results  Labs (all labs ordered are listed, but only abnormal results are displayed) Labs Reviewed - No data to display  EKG   Radiology No results found.  Procedures Procedures (including critical care time)  Medications Ordered in UC Medications  ketorolac  (TORADOL ) 30 MG/ML injection 30 mg (has no administration in time range)    Initial Impression / Assessment and Plan / UC Course  I have reviewed the triage vital signs and the nursing notes.  Pertinent labs & imaging results that were available during my care of the patient were reviewed  by me and considered in my medical decision making (see chart for details).   Final Clinical Impressions(s) / UC Diagnoses   Final diagnoses:  Acute bilateral low back pain with bilateral sciatica     Discharge Instructions      You were seen today for back pain.  I have given you a shot of a pain medication today.  This should take effect in the next 30 minutes or so.  I have sent out a steroid and a muscle relaxer.  This may make you tired/sleepy so please take when home and not driving.  You may continue heat/ice and over the counter remedies as well.  Please follow up with a primary care provider if this continues for further care and work up.     ED Prescriptions     Medication Sig Dispense Auth. Provider   tiZANidine  (ZANAFLEX ) 4 MG tablet Take 1 tablet (4 mg total) by mouth every 6 (six) hours as needed for muscle spasms. 30 tablet Tanairy Payeur, MD   predniSONE  (DELTASONE ) 20 MG tablet Take 2 tablets (40 mg total) by mouth daily for 5 days. 10 tablet Lesle Ras, MD      PDMP not reviewed this encounter.   Lesle Ras, MD 08/15/23 [phone]

## 2023-08-15 NOTE — Discharge Instructions (Signed)
 You were seen today for back pain.  I have given you a shot of a pain medication today.  This should take effect in the next 30 minutes or so.  I have sent out a steroid and a muscle relaxer.  This may make you tired/sleepy so please take when home and not driving.  You may continue heat/ice and over the counter remedies as well.  Please follow up with a primary care provider if this continues for further care and work up.

## 2023-08-15 NOTE — ED Triage Notes (Signed)
 Pt presents with low back pain x 2 weeks. States pain shoots down both legs. Denies injury. He has been taking OTC tylenol , "back aid", tiger balm, lidocaine  roll-on, ibuprofen /tylenol  combo without relief

## 2023-09-21 ENCOUNTER — Ambulatory Visit (HOSPITAL_COMMUNITY): Payer: Self-pay
